# Patient Record
Sex: Female | Born: 1955 | Race: White | Hispanic: No | Smoking: Never smoker
Health system: Southern US, Community
[De-identification: ages and names within clinical notes are randomized; demographics above are authoritative.]

## PROBLEM LIST (undated history)

## (undated) DIAGNOSIS — I10 Essential (primary) hypertension: Secondary | ICD-10-CM

## (undated) DIAGNOSIS — Z973 Presence of spectacles and contact lenses: Secondary | ICD-10-CM

## (undated) DIAGNOSIS — E785 Hyperlipidemia, unspecified: Secondary | ICD-10-CM

## (undated) DIAGNOSIS — M1990 Unspecified osteoarthritis, unspecified site: Secondary | ICD-10-CM

## (undated) DIAGNOSIS — E669 Obesity, unspecified: Secondary | ICD-10-CM

## (undated) DIAGNOSIS — E039 Hypothyroidism, unspecified: Secondary | ICD-10-CM

## (undated) DIAGNOSIS — Z87440 Personal history of urinary (tract) infections: Secondary | ICD-10-CM

## (undated) HISTORY — PX: KNEE SURGERY: SHX244

## (undated) HISTORY — PX: DILATION AND CURETTAGE OF UTERUS: SHX78

## (undated) HISTORY — DX: Essential (primary) hypertension: I10

## (undated) HISTORY — DX: Hyperlipidemia, unspecified: E78.5

## (undated) HISTORY — PX: BREAST SURGERY: SHX581

---

## 2014-09-15 ENCOUNTER — Ambulatory Visit (INDEPENDENT_AMBULATORY_CARE_PROVIDER_SITE_OTHER): Payer: Managed Care, Other (non HMO) | Admitting: Podiatry

## 2014-09-15 ENCOUNTER — Encounter: Payer: Self-pay | Admitting: Podiatry

## 2014-09-15 VITALS — BP 145/71 | HR 68 | Ht 65.0 in | Wt 230.0 lb

## 2014-09-15 DIAGNOSIS — M779 Enthesopathy, unspecified: Secondary | ICD-10-CM

## 2014-09-15 DIAGNOSIS — L84 Corns and callosities: Secondary | ICD-10-CM | POA: Insufficient documentation

## 2014-09-15 DIAGNOSIS — M79606 Pain in leg, unspecified: Secondary | ICD-10-CM | POA: Insufficient documentation

## 2014-09-15 DIAGNOSIS — M7752 Other enthesopathy of left foot: Secondary | ICD-10-CM

## 2014-09-15 DIAGNOSIS — M79605 Pain in left leg: Secondary | ICD-10-CM

## 2014-09-15 NOTE — Progress Notes (Signed)
Subjective: 58 year old female presents complaining of pain in left foot with corns in between 4th and 5th digit. Been very painful for the past few days. She keeps corn pad and removes dead skin.  Objective: Neurovascular status are within normal. Positive of mild erythema at medial aspect of the PIPJ 4th digit left with corn, and painful corn on distal medial aspect of the 5th digit left foot. Mild fungal nail both great toes. Enlarged and palpable bone spur distal medial aspect of the 5th digit left with pain.  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 Painful corn 4th and 5th digit left.  Plan: Reviewed clinical findings and available treatment options.  May benefit from resection of spur 5th digit left foot.  Area debrided and padded.

## 2014-09-15 NOTE — Patient Instructions (Signed)
Seen for painful corn 4th and 5th digit left. Debrided and padded. May benefit from surgical removal of the bone spur on 5th digit left..  Return as needed.

## 2015-10-02 ENCOUNTER — Encounter: Payer: Self-pay | Admitting: Dietician

## 2015-10-02 ENCOUNTER — Encounter: Payer: BLUE CROSS/BLUE SHIELD | Attending: General Surgery | Admitting: Dietician

## 2015-10-02 DIAGNOSIS — Z713 Dietary counseling and surveillance: Secondary | ICD-10-CM | POA: Diagnosis not present

## 2015-10-02 DIAGNOSIS — Z6841 Body Mass Index (BMI) 40.0 and over, adult: Secondary | ICD-10-CM | POA: Insufficient documentation

## 2015-10-02 NOTE — Progress Notes (Signed)
  Pre-Op Assessment Visit:  Pre-Operative Sleeve Gastrectomy Surgery  Medical Nutrition Therapy:  Appt start time: 115   End time:  145  Patient was seen on 10/02/2015 for Pre-Operative Nutrition Assessment. Assessment [facility] on 10/02/2015.   Preferred Learning Style:   No preference indicated   Learning Readiness:   Ready  Handouts given during visit include:  Pre-Op Goals Bariatric Surgery Protein Shakes   During the appointment today the following Pre-Op Goals were reviewed with the patient: Maintain or lose weight as instructed by your surgeon Make healthy food choices Begin to limit portion sizes Limited concentrated sugars and fried foods Keep fat/sugar in the single digits per serving on   food labels Practice CHEWING your food  (aim for 30 chews per bite or until applesauce consistency) Practice not drinking 15 minutes before, during, and 30 minutes after each meal/snack Avoid all carbonated beverages  Avoid/limit caffeinated beverages  Avoid all sugar-sweetened beverages Consume 3 meals per day; eat every 3-5 hours Make a list of non-food related activities Aim for 64-100 ounces of FLUID daily  Aim for at least 60-80 grams of PROTEIN daily Look for a liquid protein source that contain ?15 g protein and ?5 g carbohydrate  (ex: shakes, drinks, shots)  Patient-Centered Goals: -Reduced knee pain (knee replacement) -Zipline -Amusement park rides -Horseback riding  -[place] trail hiking  Demonstrated degree of understanding via:  Teach Back  Teaching Method Utilized:  Visual Auditory Hands on  Barriers to learning/adherence to lifestyle change: none  [facility] to call the [facility] to enroll in Pre-Op and Post-Op Nutrition Education when surgery date is scheduled.

## 2015-11-03 ENCOUNTER — Encounter: Payer: BLUE CROSS/BLUE SHIELD | Attending: General Surgery | Admitting: Dietician

## 2015-11-03 ENCOUNTER — Encounter: Payer: Self-pay | Admitting: Dietician

## 2015-11-03 DIAGNOSIS — Z01818 Encounter for other preprocedural examination: Secondary | ICD-10-CM | POA: Insufficient documentation

## 2015-11-03 NOTE — Patient Instructions (Addendum)
Plan to do some more meal planning and preparation. Continue to work on chewing well.  Think about about trying protein shakes.  Talk to podiatrist about exercise ideas. Or go back to water aerobics or yoga.

## 2015-11-03 NOTE — Progress Notes (Signed)
  6 Months Supervised Weight Loss Visit:   Pre-Operative Sleeve Gastrectomy Surgery  Medical Nutrition Therapy:  Appt start time: 1640 end time:  1655.  Primary concerns today: Supervised Weight Loss Visit. Returns with no weight change. Has a bone spur on her heal and was on Prednisone for 6 days. Will be seeing a podiatrist to see next steps. Not able to walk like she would like because of the bone spur. Has been paying attention to portion sizes and working on chewing better.  Eating 3 meals per day. Drinks 1 coffee per day and drinks a lot of water or unsweet tea the rest of the day. Has cut back on fried foods though has some sweets some times. Does not snack much. Lives by herself and doesn't do a lot of cooking. Does not drink during meals.    Weight: 268.8 BMI: 44.7  Preferred Learning Style:   No preference indicated   Learning Readiness:   Ready   Recent physical activity:  none  Progress Towards Goal(s):  In progress.  Handouts given during visit include:  none    Nutritional Diagnosis:  [place]-3.3 Obesity related to past poor dietary habits and physical inactivity as evidenced by patient attending supervised weight loss for insurance approval of bariatric surgery.  Intervention:  Nutrition counseling provided. Plan: Plan to do some more meal planning and preparation. Continue to work on chewing well.  Think about about trying protein shakes.  Talk to podiatrist about exercise ideas. Or go back to water aerobics or yoga.   Teaching Method Utilized:  Visual Auditory Hands on  Barriers to learning/adherence to lifestyle change: none  Demonstrated degree of understanding via:  Teach Back   Monitoring/Evaluation:  Dietary intake, exercise, and body weight. Follow up in 1 months for 6 month supervised weight loss visit.

## 2015-12-02 ENCOUNTER — Encounter: Payer: Self-pay | Admitting: Dietician

## 2015-12-02 ENCOUNTER — Encounter: Payer: BLUE CROSS/BLUE SHIELD | Attending: General Surgery | Admitting: Dietician

## 2015-12-02 DIAGNOSIS — Z6841 Body Mass Index (BMI) 40.0 and over, adult: Secondary | ICD-10-CM | POA: Insufficient documentation

## 2015-12-02 DIAGNOSIS — Z713 Dietary counseling and surveillance: Secondary | ICD-10-CM | POA: Diagnosis not present

## 2015-12-02 NOTE — Progress Notes (Signed)
  6 Months Supervised Weight Loss Visit:   Pre-Operative Sleeve Gastrectomy Surgery  Medical Nutrition Therapy:  Appt start time: 500 end time:  515  Primary concerns today: Supervised Weight Loss Visit. Returns having gained 2 pounds. She reports she is still having trouble with bone spur. She is going to physical therapy and is off prednisone. Also plans [facility]. She tried the Premier protein shake and liked it okay; has it for breakfast and morning snack. Has been practicing chewing thoroughly and eating slowly. Finds this challenging. Working on meal planning and preparation.    Weight: 270.6 lbs BMI: 45.1  Preferred Learning Style:   No preference indicated   Learning Readiness:   Ready   Recent physical activity:  Physical activity 1x a week  Progress Towards Goal(s):  In progress.  Handouts given during visit include:  none    Nutritional Diagnosis:  [place]-3.3 Obesity related to past poor dietary habits and physical inactivity as evidenced by patient attending supervised weight loss for insurance approval of bariatric surgery.  Intervention:  Nutrition counseling provided. Plan: Plan to do some more meal planning and preparation. Continue to work on chewing well.  Keep using the buddy system for eating slowly when you're out to eat and exercise program.  Teaching Method Utilized:  Visual Auditory Hands on  Barriers to learning/adherence to lifestyle change: none  Demonstrated degree of understanding via:  Teach Back   Monitoring/Evaluation:  Dietary intake, exercise, and body weight. Follow up in 1 months for 6 month supervised weight loss visit.

## 2015-12-02 NOTE — Patient Instructions (Addendum)
Plan to do some more meal planning and preparation. Continue to work on chewing well.  Keep using the buddy system for eating slowly when you're out to eat and exercise program.

## 2016-01-05 ENCOUNTER — Ambulatory Visit: Payer: Self-pay | Admitting: Dietician

## 2016-01-17 ENCOUNTER — Encounter: Payer: BLUE CROSS/BLUE SHIELD | Attending: General Surgery | Admitting: Dietician

## 2016-01-17 ENCOUNTER — Encounter: Payer: Self-pay | Admitting: Dietician

## 2016-01-17 DIAGNOSIS — Z713 Dietary counseling and surveillance: Secondary | ICD-10-CM | POA: Diagnosis not present

## 2016-01-17 DIAGNOSIS — Z6841 Body Mass Index (BMI) 40.0 and over, adult: Secondary | ICD-10-CM | POA: Insufficient documentation

## 2016-01-17 NOTE — Progress Notes (Signed)
Supervised Weight Loss Class:  Appt start time: 1600   End time:  1630.  Patient was seen on 01/17/2016 for the "Fluid" Supervised Weight Loss Class at [facility].   Surgery type: Sleeve gastrectomy Start weight at [facility]: 269 lbs on 10/02/2015 Weight today: 277.6 lbs Weight change: 7 lbs gain  Objectives: -Discuss importance of fluid and hydration status after surgery -Identify appropriate fluids after surgery -Discuss importance of limiting caffeine in the perioperative period -Review importance of avoiding drinking while eating  Goals: -Begin to wean off of caffeine -Practice avoiding fluids 15 minutes before, during, and 30 minutes after meals -Begin to work your way up to 64 ounces of sugar free, caffeine free, non-carbonated fluids  Handouts given: "Fluid" handout

## 2016-02-15 ENCOUNTER — Ambulatory Visit: Payer: BLUE CROSS/BLUE SHIELD | Admitting: Dietician

## 2016-03-13 ENCOUNTER — Encounter: Payer: Self-pay | Admitting: Dietician

## 2016-03-13 ENCOUNTER — Encounter: Payer: BLUE CROSS/BLUE SHIELD | Attending: General Surgery | Admitting: Dietician

## 2016-03-13 DIAGNOSIS — Z6841 Body Mass Index (BMI) 40.0 and over, adult: Secondary | ICD-10-CM | POA: Insufficient documentation

## 2016-03-13 DIAGNOSIS — Z713 Dietary counseling and surveillance: Secondary | ICD-10-CM | POA: Diagnosis not present

## 2016-03-13 NOTE — Patient Instructions (Signed)
Continue do some more meal planning and preparation. Continue to work on chewing well.

## 2016-03-13 NOTE — Progress Notes (Signed)
  6 Months Supervised Weight Loss Visit:   Pre-Operative Sleeve Gastrectomy Surgery  Medical Nutrition Therapy:  Appt start time: 510 end time:  525  Primary concerns today: Supervised Weight Loss Visit. Returns having gained 4.7 lbs. Had the flu last month and having a D&C since she having some bleeding. [facility]. Joined a step counting group at work. Bone spur is getting better. Still having some Premier protein shakes. Always has been a fast eating and working on chewing slower. Drinks mostly water and has 1 cup of coffee per tea.  Still working on meal planning and preparation. Has some salads made up.  Surgery type: Sleeve gastrectomy [facility] lbs on 10/02/2015 Weight today: 272.9 lbs  Weight change: 4.7 lbs loss   Preferred Learning Style:   No preference indicated   Learning Readiness:   Ready   Recent physical activity:  Physical activity 1x a week  Progress Towards Goal(s):  In progress.  Handouts given during visit include:  none    Nutritional Diagnosis:  -3.3 Obesity related to past poor dietary habits and physical inactivity as evidenced by patient attending supervised weight loss for insurance approval of bariatric surgery.  Intervention:  Nutrition counseling provided. Plan: Continue do some more meal planning and preparation. Continue to work on chewing well.   Teaching Method Utilized:  Visual Auditory Hands on  Barriers to learning/adherence to lifestyle change: none  Demonstrated degree of understanding via:  Teach Back   Monitoring/Evaluation:  Dietary intake, exercise, and body weight. Follow up in 1 months for 6 month supervised weight loss visit.

## 2016-04-05 ENCOUNTER — Encounter: Payer: Self-pay | Admitting: Dietician

## 2016-04-05 ENCOUNTER — Encounter: Payer: BLUE CROSS/BLUE SHIELD | Attending: General Surgery | Admitting: Dietician

## 2016-04-05 DIAGNOSIS — Z6841 Body Mass Index (BMI) 40.0 and over, adult: Secondary | ICD-10-CM | POA: Diagnosis not present

## 2016-04-05 DIAGNOSIS — Z713 Dietary counseling and surveillance: Secondary | ICD-10-CM | POA: Diagnosis not present

## 2016-04-05 NOTE — Patient Instructions (Signed)
Continue do some more meal planning and preparation. Continue to work on chewing well.

## 2016-04-05 NOTE — Progress Notes (Signed)
  6 Months Supervised Weight Loss Visit:   Pre-Operative Sleeve Gastrectomy Surgery  Medical Nutrition Therapy:  Appt start time: 510 end time:  525  Primary concerns today: Supervised Weight Loss Visit. Returns having lost 4 lbs. Had a female cancer scare but she was found not to have cancer. Has UTI. Switched to decaf coffee but has not made many other changes this month.   [facility]. Joined a step counting group at work. Bone spur is getting better. Still having some Premier protein shakes. Always has been a fast eating and working on chewing slower.Still working on meal planning and preparation. Not having fried foods or sweets.   Surgery type: Sleeve gastrectomy [facility] lbs on 10[facility]02/2015  Weight today: 268.1 lbs  Weight change: 4 lbs loss   Preferred Learning Style:   No preference indicated   Learning Readiness:   Ready   Recent physical activity:  Physical activity 2x a week and walking   Progress Towards Goal(s):  In progress.  Handouts given during visit include:  none    Nutritional Diagnosis:  [place]-3.3 Obesity related to past poor dietary habits and physical inactivity as evidenced by patient attending supervised weight loss for insurance approval of bariatric surgery.  Intervention:  Nutrition counseling provided. Plan: Continue do some more meal planning and preparation. Continue to work on chewing well.   Teaching Method Utilized:  Visual Auditory Hands on  Barriers to learning/adherence to lifestyle change: none  Demonstrated degree of understanding via:  Teach Back   Monitoring/Evaluation:  Dietary intake, exercise, and body weight. Follow up to attend Pre Op Class.

## 2016-04-10 ENCOUNTER — Ambulatory Visit: Payer: BLUE CROSS/BLUE SHIELD | Admitting: Dietician

## 2016-06-08 ENCOUNTER — Other Ambulatory Visit (HOSPITAL_COMMUNITY): Payer: Self-pay | Admitting: General Surgery

## 2016-07-19 ENCOUNTER — Ambulatory Visit (HOSPITAL_COMMUNITY)
Admission: RE | Admit: 2016-07-19 | Discharge: 2016-07-19 | Disposition: A | Discharge status: Home / Self Care | Payer: BLUE CROSS/BLUE SHIELD | Source: Ambulatory Visit | Attending: General Surgery | Admitting: General Surgery

## 2016-07-19 ENCOUNTER — Other Ambulatory Visit (HOSPITAL_COMMUNITY): Payer: Self-pay | Admitting: General Surgery

## 2016-07-19 ENCOUNTER — Other Ambulatory Visit: Payer: Self-pay

## 2016-07-19 DIAGNOSIS — Z01818 Encounter for other preprocedural examination: Secondary | ICD-10-CM | POA: Diagnosis not present

## 2016-07-19 DIAGNOSIS — K228 Other specified diseases of esophagus: Secondary | ICD-10-CM | POA: Insufficient documentation

## 2016-08-28 ENCOUNTER — Encounter: Payer: BLUE CROSS/BLUE SHIELD | Attending: General Surgery | Admitting: Dietician

## 2016-08-28 ENCOUNTER — Encounter: Payer: Self-pay | Admitting: Dietician

## 2016-08-28 DIAGNOSIS — Z713 Dietary counseling and surveillance: Secondary | ICD-10-CM | POA: Diagnosis present

## 2016-08-28 NOTE — Progress Notes (Signed)
  Pre-Operative Nutrition Class:  Appt start time: 830   End time:  930.  Patient was seen on 08/28/2016 for Pre-Operative Bariatric Surgery Education at the [facility].   Surgery date: 09/11/2016 Surgery type: sleeve gastrectomy Start weight at [facility]: 269 lbs on 10/02/2015 Weight today: 275.2 lbs  TANITA  BODY COMP RESULTS  08/28/16   BMI (kg/m^2) 45.8   Fat Mass (lbs) 140   Fat Free Mass (lbs) 135.2   Total Body Water (lbs) 99.2   Samples given per MNT protocol. Patient educated on appropriate usage: Bariatric Advantage Multivitamin (mixed fruit - qty 1) Lot #: [id number] Exp: 05/2017  Bariatric Advantage Calcium Citrate chew (strawberry - qty 1) Lot #: [id number] Exp: 03/2017  Premier protein shake (strawberry - qty 1) Lot #: [id number] Exp: 07/2017  Renee Pain Protein Powder (chocolate splendor - qty 1) Lot #: [id number] Exp: 11/2017  The following the learning objectives were met by the patient during this course:  Identify Pre-Op Dietary Goals and will begin 2 weeks pre-operatively  Identify appropriate sources of fluids and proteins   State protein recommendations and appropriate sources pre and post-operatively  Identify Post-Operative Dietary Goals and will follow for 2 weeks post-operatively  Identify appropriate multivitamin and calcium sources  Describe the need for physical activity post-operatively and will follow MD recommendations  State when to call healthcare provider regarding medication questions or post-operative complications  Handouts given during class include:  Pre-Op Bariatric Surgery Diet Handout  Protein Shake Handout  Post-Op Bariatric Surgery Nutrition Handout  BELT Program Information Flyer  Support Group Information Flyer  [facility] Bariatric Supplements Price List  Follow-Up Plan: Patient will follow-up at [facility] 2 weeks post operatively for diet advancement per MD.

## 2016-08-30 ENCOUNTER — Ambulatory Visit: Payer: Self-pay | Admitting: General Surgery

## 2016-08-30 NOTE — H&P (Signed)
Margaret Obrien 08/30/2016 9:41 AM Location: [facility] Patient #: [id number] DOB: [birth year] Widowed / Language: Cleophus Molt / Race: White Female  History of Present Illness [name] M. Keelin Sheridan MD; 08/30/2016 10:26 AM) The patient is a 60 year old female who presents for a bariatric surgery evaluation. I initially met her 1 year ago for evaluation for weight loss surgery. She had undergo 6 month supervised weight loss. She has been approved for laparoscopic sleeve gastrectomy. She denies any major medical changes since she was last seen about a year ago. She did have to undergo a D&C for bleeding. Otherwise she denies any chest pain, chest pressure, source of breath, orthopnea, dyspnea on exertion, paroxysmal nocturnal dyspnea. She does take Lasix as needed for bilateral ankle edema. She denies any TIAs or amaurosis fugax. She denies any reflux. she last saw her cardiologist in 01/2015 Dr Gerarda Gunther and there were no concerns noted at that time  Her upper GI was within normal limits. Chest x-ray and EKG were also located. Labs done in September 2017 were within normal limits. She did test positive for H. pylori. She completed a Prevpac.  A comprehensive 12 point review of systems was performed and all systems are negative except for what is mentioned in the HPI  08/2015 She is referred by Dr Kandee Keen to discuss weight loss surgery. She is primarily interested in sleeve gastrectomy. It appeals to her because it is less invasive than a gastric bypass. It seems more effective than an adjustable gastric band. She participated in our online seminar. She states that she has struggled many years with her weight. She will lose the weight but ultimately regain it. She has tried Weight Watchers, phentermine, Nutrisystem, regular exercise at the [facility] without any long-term success. One of her goals is to be able to ride rides in an amusement park, hike the [employer].  Her  comorbidities include hypertension, dyslipidemia, left knee osteoarthritis  She denies any chest pain, chest pressure, source of breath, dyspnea on exertion, orthopnea, paroxysmal term dyspnea, prior blood clots, TIAs or amaurosis fugax. She denies any reflux. She denies any abdominal pain. She denies abdominal surgery. She denies any melena or hematochezia or constipation. She denies any dysuria. She does have left knee pain. She denies any lightheadedness or dizziness. She does take Synthroid for hypothyroidism. She denies any migraines. She denies any tobacco or drug use. She drinks alcohol on very rare occasion. She works at [employer] [place].  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 Colon Polyp Removal - Colonoscopy Breast Biopsy Left.  Diagnostic Studies History [name], MD; 08/30/2016 10:25 AM) Mammogram within last year Colonoscopy 5-10 years ago Pap Smear 1-5 years ago  Allergies Marjean Donna, [place]; 08/30/2016 9:41 AM) Lisinopril *ANTIHYPERTENSIVES*  Medication History [name], MD; 08/30/2016 10:25 AM) Synthroid (112MCG Tablet, Oral) Active. Cozaar (25MG Tablet, Oral) Active. Vitamin D (2000UNIT Tablet, Oral) Active. Fish Oil (1000MG Capsule DR, Oral) Active. Voltaren (50MG Tablet DR, Oral) Active. Furosemide (20MG Tablet, Oral) Active.  Medications Reconciled OxyCODONE HCl (5MG/5ML Solution, 5-10 Milliliter  Oral every four hours, as needed, Taken starting 08/30/2016) Active. Pantoprazole Sodium (40MG Tablet DR, 1 (one) Tablet Oral daily, Taken starting 08/30/2016) Active. Zofran ODT (4MG Tablet Disint, 1 (one) Tablet Disperse Oral every six hours, as needed, Taken starting 08/30/2016) Active.  Social History [name], MD; 08/30/2016 10:25 AM) Tobacco use Never smoker. No drug use No alcohol use Caffeine use Coffee, Tea.  Family History [name], MD; 08/30/2016 10:25 AM) Arthritis Brother, Mother. Alcohol Abuse Brother, Father. Respiratory Condition Father. Heart disease in female family member before age 94 Colon Cancer Sister. Cerebrovascular Accident Mother. Heart Disease Mother. Diabetes Mellitus Brother, Mother.  Pregnancy / Birth History [name], MD; 08/30/2016 10:25 AM) [name] 1 Contraceptive History Oral contraceptives. Maternal age 63-20 Irregular periods Age of menopause 13-55 Age at menarche 78 years. Para 1     Review of Systems [name] M. Akina Maish MD; 08/30/2016 10:21 AM) General Present- Weight Gain. Not Present- Appetite Loss, Chills, Fatigue, Fever, Night Sweats and Weight Loss. Skin Not Present- Change in Wart/Mole, Dryness, Hives, Jaundice, New Lesions, Non-Healing Wounds, Rash and Ulcer. HEENT Present- Wears glasses/contact lenses. Not Present- Earache, Hearing Loss, Hoarseness, Nose Bleed, Oral Ulcers, Ringing in the Ears, Seasonal Allergies, Sinus Pain, Sore Throat, Visual Disturbances and Yellow Eyes. Respiratory Not Present- Bloody sputum, Chronic Cough, Difficulty Breathing, Snoring and Wheezing. Cardiovascular Not Present- Chest Pain, Difficulty Breathing Lying Down, Leg Cramps, Palpitations, Rapid Heart Rate, Shortness of Breath and Swelling of Extremities. Gastrointestinal Not Present- Abdominal Pain, Bloating, Bloody Stool, Change in Bowel Habits, Chronic diarrhea, Constipation, Difficulty Swallowing, Excessive gas, Gets  full quickly at meals, Hemorrhoids, Indigestion, Nausea, Rectal Pain and Vomiting. Female Genitourinary Not Present- Frequency, Nocturia, Painful Urination, Pelvic Pain and Urgency. Musculoskeletal Present- Joint Pain. Not Present- Back Pain, Joint Stiffness, Muscle Pain, Muscle Weakness and Swelling of Extremities. Neurological Not Present- Decreased Memory, Fainting, Headaches, Numbness, Seizures, Tingling, Tremor, Trouble walking and Weakness. Psychiatric Not Present- Anxiety, Bipolar, Change in Sleep Pattern, Depression, Fearful and Frequent crying. Endocrine Not Present- Cold Intolerance, Excessive Hunger, Hair Changes, Heat Intolerance, Hot flashes and New Diabetes. Hematology Not Present- Easy Bruising, Excessive bleeding, Gland problems, HIV and Persistent Infections.  Vitals ([name] CMA; 08/30/2016 9:41 AM) 08/30/2016 9:41 AM Weight: 272.6 lb Height: 64.5in Body Surface Area: 2.24 m Body Mass Index: 46.07 kg/m  Temp.: 98.68F(Temporal)  Pulse: 76 (Regular)  BP: 128/76 (Sitting, Left Arm, Standard)      Physical Exam [name] M. Smokey Melott MD; 08/30/2016 10:21 AM)  General Mental Status-Alert. General Appearance-Consistent with stated age. Hydration-Well hydrated. Voice-Normal. Note: morbidly obese  Head and Neck Head-normocephalic, atraumatic with no lesions or palpable masses. Trachea-midline. Thyroid Gland Characteristics - normal size and consistency.  Eye Eyeball - Bilateral-Extraocular movements intact. Sclera/Conjunctiva - Bilateral-No scleral icterus.  Chest and Lung Exam Chest and lung exam reveals -quiet, even and easy respiratory effort with no use of accessory muscles and on auscultation, normal breath sounds, no adventitious sounds and normal vocal resonance. Inspection Chest Wall - Normal. Back - normal.  Breast - Did not examine.  Cardiovascular Cardiovascular examination reveals -normal heart sounds, regular rate and  rhythm with no murmurs and normal pedal pulses bilaterally.  Abdomen Inspection Inspection of the abdomen reveals - No Hernias. Skin - Scar - no surgical scars. Palpation/Percussion Palpation and Percussion of the abdomen reveal - Soft, Non Tender, No Rebound tenderness, No Rigidity (guarding) and No hepatosplenomegaly. Auscultation Auscultation of the abdomen reveals -  Bowel sounds normal.  Peripheral Vascular Upper Extremity Palpation - Pulses bilaterally normal.  Neurologic Neurologic evaluation reveals -alert and oriented x 3 with no impairment of recent or remote memory. Mental Status-Normal.  Neuropsychiatric The patient's mood and affect are described as -normal. Judgment and Insight-insight is appropriate concerning matters relevant to self.  Musculoskeletal Normal Exam - Left-Upper Extremity Strength Normal and Lower Extremity Strength Normal. Normal Exam - Right-Upper Extremity Strength Normal and Lower Extremity Strength Normal. Note: right knee crepitus  Lymphatic Head & Neck  General Head & Neck Lymphatics: Bilateral - Description - Normal. Axillary - Did not examine. Femoral & Inguinal - Did not examine.    Assessment & Plan [name] M. Hena Ewalt MD; 08/30/2016 10:25 AM)  OBESITY, MORBID, BMI 40.0-49.9 Impression: We reviewed her preoperative workup. We discussed results of her labs, upper GI. We discussed the importance of the preoperative diet plan. We rediscussed the typical hospitalization. We also rediscussed the potential risk for complications of laparoscopic sleeve gastrectomy such as bleeding, infection, injury to surrounding structures, reflux, leak, readmission, dehydration, death. We also discussed the typical postoperative course. She was given her postoperative pain, heartburn, and nausea prescriptions today. I encouraged her to contact the office should she have any additional questions between now and surgery.  Current Plans Pt Education -  EMW_preopbariatric Started Zofran ODT 4MG, 1 (one) Tablet Disperse every six hours, as needed, #20, 08/30/2016, No Refill. Started OxyCODONE HCl 5MG/5ML, 5-10 Milliliter every four hours, as needed, 200 Milliliter, 08/30/2016, No Refill. Started Pantoprazole Sodium 40MG, 1 (one) Tablet daily, #30, 30 days starting 08/30/2016, Ref. x3. POSITIVE H. PYLORI TEST (A04.8) Impression: We will asked pathology to test her extracted gastric remnant for residual H. 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 Redmond Pulling, MD, FACS General, Bariatric, & Minimally Invasive Surgery [facility], [place]

## 2016-09-07 NOTE — Patient Instructions (Addendum)
Margaret Obrien  09/08/2016   Your procedure is scheduled on: Monday September 11, 2016  [facility]  Entrance [place]  elevators to 3rd floor to  Short Stay Center at 9:15 AM.  Call this number if you have problems the morning of surgery [phone]   Remember: ONLY 1 PERSON MAY GO WITH YOU TO SHORT STAY TO GET  READY MORNING OF YOUR SURGERY.  Do not eat food or drink liquids :After Midnight.     Take these medicines the morning of surgery with A SIP OF WATER: Levothyroxine                                 You may not have any metal on your body including hair pins and              piercings  Do not wear jewelry, make-up, lotions, powders or perfumes, deodorant             Do not wear nail polish.  Do not shave  48 hours prior to surgery.                Do not bring valuables to the hospital. [facility] IS NOT             RESPONSIBLE   FOR VALUABLES.  Contacts, dentures or bridgework may not be worn into surgery.  Leave suitcase in the car. After surgery it may be brought to your room. _____________________________________________________________________             [facility] Pre[facility] Before surgery, you can play an important role.  Because skin is not sterile, your skin needs to be as free of germs as possible.  You can reduce the number of germs on your skin by washing with CHG (chlorahexidine gluconate) soap before surgery.  CHG is an antiseptic cleaner which kills germs and bonds with the skin to continue killing germs even after washing. Please DO NOT use if you have an allergy to CHG or antibacterial soaps.  If your skin becomes reddened/irritated stop using the CHG and inform your nurse when you arrive at Short Stay. Do not shave (including legs and underarms) for at least 48 hours prior to the first CHG shower.  You may shave your face/neck. Please follow these instructions carefully:  1.  Shower  with CHG Soap the night before surgery and the  morning of Surgery.  2.  If you choose to wash your hair, wash your hair first as usual with your  normal  shampoo.  3.  After you shampoo, rinse your hair and body thoroughly to remove the  shampoo.                           4.  Use CHG as you would any other liquid soap.  You can apply chg directly  to the skin and wash                       Gently with a scrungie or clean washcloth.  5.  Apply the CHG Soap to your body ONLY FROM THE NECK DOWN.  Do not use on face/ open                           Wound or open sores. Avoid contact with eyes, ears mouth and genitals (private parts).                       Wash face,  Genitals (private parts) with your normal soap.             6.  Wash thoroughly, paying special attention to the area where your surgery  will be performed.  7.  Thoroughly rinse your body with warm water from the neck down.  8.  DO NOT shower/wash with your normal soap after using and rinsing off  the CHG Soap.                9.  Pat yourself dry with a clean towel.            10.  Wear clean pajamas.            11.  Place clean sheets on your bed the night of your first shower and do not  sleep with pets. Day of Surgery : Do not apply any lotions/deodorants the morning of surgery.  Please wear clean clothes to the hospital/surgery center.  FAILURE TO FOLLOW THESE INSTRUCTIONS MAY RESULT IN THE CANCELLATION OF YOUR SURGERY PATIENT SIGNATURE_________________________________  NURSE SIGNATURE__________________________________  ________________________________________________________________________

## 2016-09-08 ENCOUNTER — Encounter (HOSPITAL_COMMUNITY)
Admission: RE | Admit: 2016-09-08 | Discharge: 2016-09-08 | Disposition: A | Discharge status: Home / Self Care | Payer: BLUE CROSS/BLUE SHIELD | Source: Ambulatory Visit | Attending: General Surgery | Admitting: General Surgery

## 2016-09-08 ENCOUNTER — Encounter (HOSPITAL_COMMUNITY): Payer: Self-pay

## 2016-09-08 HISTORY — DX: Hypothyroidism, unspecified: E03.9

## 2016-09-08 HISTORY — DX: Presence of spectacles and contact lenses: Z97.3

## 2016-09-08 HISTORY — DX: Personal history of urinary (tract) infections: Z87.440

## 2016-09-08 HISTORY — DX: Obesity, unspecified: E66.9

## 2016-09-08 HISTORY — DX: Unspecified osteoarthritis, unspecified site: M19.90

## 2016-09-08 LAB — COMPREHENSIVE METABOLIC PANEL
ALK PHOS: 62 U/L (ref 38–126)
ALT: 23 U/L (ref 14–54)
ANION GAP: 8 (ref 5–15)
AST: 24 U/L (ref 15–41)
Albumin: 4.4 g/dL (ref 3.5–5.0)
BILIRUBIN TOTAL: 0.8 mg/dL (ref 0.3–1.2)
BUN: 17 mg/dL (ref 6–20)
CALCIUM: 9.7 mg/dL (ref 8.9–10.3)
CO2: 26 mmol/L (ref 22–32)
Chloride: 103 mmol/L (ref 101–111)
Creatinine, Ser: 0.82 mg/dL (ref 0.44–1.00)
Glucose, Bld: 87 mg/dL (ref 65–99)
POTASSIUM: 4.2 mmol/L (ref 3.5–5.1)
Sodium: 137 mmol/L (ref 135–145)
TOTAL PROTEIN: 7.9 g/dL (ref 6.5–8.1)

## 2016-09-08 LAB — CBC WITH DIFFERENTIAL/PLATELET
BASOS PCT: 0 %
Basophils Absolute: 0 10*3/uL (ref 0.0–0.1)
Eosinophils Absolute: 0.2 10*3/uL (ref 0.0–0.7)
Eosinophils Relative: 3 %
HEMATOCRIT: 40.5 % (ref 36.0–46.0)
HEMOGLOBIN: 13 g/dL (ref 12.0–15.0)
LYMPHS ABS: 2.1 10*3/uL (ref 0.7–4.0)
LYMPHS PCT: 30 %
MCH: 27.7 pg (ref 26.0–34.0)
MCHC: 32.1 g/dL (ref 30.0–36.0)
MCV: 86.2 fL (ref 78.0–100.0)
MONO ABS: 0.5 10*3/uL (ref 0.1–1.0)
MONOS PCT: 7 %
NEUTROS ABS: 4.3 10*3/uL (ref 1.7–7.7)
NEUTROS PCT: 60 %
Platelets: 277 10*3/uL (ref 150–400)
RBC: 4.7 MIL/uL (ref 3.87–5.11)
RDW: 14.4 % (ref 11.5–15.5)
WBC: 7.1 10*3/uL (ref 4.0–10.5)

## 2016-09-11 ENCOUNTER — Inpatient Hospital Stay (HOSPITAL_COMMUNITY)
Admission: RE | Admit: 2016-09-11 | Discharge: 2016-09-13 | DRG: 621 | Disposition: A | Discharge status: Home / Self Care | Payer: BLUE CROSS/BLUE SHIELD | Source: Ambulatory Visit | Attending: General Surgery | Admitting: General Surgery

## 2016-09-11 ENCOUNTER — Encounter (HOSPITAL_COMMUNITY): Payer: Self-pay | Admitting: *Deleted

## 2016-09-11 ENCOUNTER — Inpatient Hospital Stay (HOSPITAL_COMMUNITY): Payer: BLUE CROSS/BLUE SHIELD | Admitting: Anesthesiology

## 2016-09-11 ENCOUNTER — Encounter (HOSPITAL_COMMUNITY)
Admission: RE | Disposition: A | Discharge status: Home / Self Care | Payer: Self-pay | Source: Ambulatory Visit | Attending: General Surgery

## 2016-09-11 DIAGNOSIS — M1712 Unilateral primary osteoarthritis, left knee: Secondary | ICD-10-CM | POA: Diagnosis present

## 2016-09-11 DIAGNOSIS — I10 Essential (primary) hypertension: Secondary | ICD-10-CM | POA: Diagnosis present

## 2016-09-11 DIAGNOSIS — E782 Mixed hyperlipidemia: Secondary | ICD-10-CM | POA: Diagnosis present

## 2016-09-11 DIAGNOSIS — Z9884 Bariatric surgery status: Secondary | ICD-10-CM

## 2016-09-11 DIAGNOSIS — E781 Pure hyperglyceridemia: Secondary | ICD-10-CM | POA: Diagnosis present

## 2016-09-11 DIAGNOSIS — E039 Hypothyroidism, unspecified: Secondary | ICD-10-CM | POA: Diagnosis present

## 2016-09-11 DIAGNOSIS — Z6841 Body Mass Index (BMI) 40.0 and over, adult: Secondary | ICD-10-CM

## 2016-09-11 DIAGNOSIS — E66813 Obesity, class 3: Secondary | ICD-10-CM | POA: Diagnosis present

## 2016-09-11 DIAGNOSIS — E7800 Pure hypercholesterolemia, unspecified: Secondary | ICD-10-CM | POA: Diagnosis present

## 2016-09-11 HISTORY — PX: LAPAROSCOPIC GASTRIC SLEEVE RESECTION: SHX5895

## 2016-09-11 LAB — HEMOGLOBIN AND HEMATOCRIT, BLOOD
HCT: 36.7 % (ref 36.0–46.0)
Hemoglobin: 12.1 g/dL (ref 12.0–15.0)

## 2016-09-11 SURGERY — GASTRECTOMY, SLEEVE, LAPAROSCOPIC
Anesthesia: General

## 2016-09-11 MED ORDER — MORPHINE SULFATE (PF) 2 MG/ML IV SOLN: 2.0000 mg | INTRAVENOUS | Status: DC | PRN

## 2016-09-11 MED ORDER — HYDROMORPHONE HCL 1 MG/ML IJ SOLN
0.2500 mg | INTRAMUSCULAR | Status: DC | PRN
  Administered 2016-09-11 (×4): 0.25 mg via INTRAVENOUS
  Administered 2016-09-11 (×2): 0.5 mg via INTRAVENOUS

## 2016-09-11 MED ORDER — PREMIER PROTEIN SHAKE
2.0000 [oz_av] | ORAL | Status: DC
  Administered 2016-09-13 (×2): 2 [oz_av] via ORAL

## 2016-09-11 MED ORDER — METOCLOPRAMIDE HCL 5 MG/ML IJ SOLN
INTRAMUSCULAR | Status: AC
  Filled 2016-09-11: qty 2

## 2016-09-11 MED ORDER — LIDOCAINE 2% (20 MG/ML) 5 ML SYRINGE
INTRAMUSCULAR | Status: DC | PRN
  Administered 2016-09-11: 40 mg via INTRAVENOUS

## 2016-09-11 MED ORDER — 0.9 % SODIUM CHLORIDE (POUR BTL) OPTIME
TOPICAL | Status: DC | PRN
  Administered 2016-09-11: 2000 mL
  Administered 2016-09-11: 1000 mL

## 2016-09-11 MED ORDER — BUPIVACAINE LIPOSOME 1.3 % IJ SUSP
20.0000 mL | Freq: Once | INTRAMUSCULAR | Status: AC
  Administered 2016-09-11: 20 mL
  Filled 2016-09-11: qty 20

## 2016-09-11 MED ORDER — FENTANYL CITRATE (PF) 100 MCG/2ML IJ SOLN
25.0000 ug | INTRAMUSCULAR | Status: DC | PRN
  Administered 2016-09-11 (×2): 50 ug via INTRAVENOUS

## 2016-09-11 MED ORDER — OXYCODONE HCL 5 MG/5ML PO SOLN
5.0000 mg | ORAL | Status: DC | PRN
  Administered 2016-09-12: 5 mg via ORAL
  Filled 2016-09-11: qty 5

## 2016-09-11 MED ORDER — DIPHENHYDRAMINE HCL 50 MG/ML IJ SOLN: 12.5000 mg | Freq: Three times a day (TID) | INTRAMUSCULAR | Status: DC | PRN

## 2016-09-11 MED ORDER — LACTATED RINGERS IV SOLN
INTRAVENOUS | Status: DC
  Administered 2016-09-11: 13:00:00 via INTRAVENOUS
  Administered 2016-09-11: 1000 mL via INTRAVENOUS

## 2016-09-11 MED ORDER — METOCLOPRAMIDE HCL 5 MG/ML IJ SOLN
INTRAMUSCULAR | Status: DC | PRN
  Administered 2016-09-11: 10 mg via INTRAVENOUS

## 2016-09-11 MED ORDER — LACTATED RINGERS IR SOLN
Status: DC | PRN
  Administered 2016-09-11: 1000 mL

## 2016-09-11 MED ORDER — SUGAMMADEX SODIUM 500 MG/5ML IV SOLN
INTRAVENOUS | Status: AC
  Filled 2016-09-11: qty 5

## 2016-09-11 MED ORDER — FENTANYL CITRATE (PF) 100 MCG/2ML IJ SOLN
INTRAMUSCULAR | Status: AC
  Filled 2016-09-11: qty 2

## 2016-09-11 MED ORDER — CHLORHEXIDINE GLUCONATE 4 % EX LIQD: 60.0000 mL | Freq: Once | CUTANEOUS | Status: DC

## 2016-09-11 MED ORDER — HYDROMORPHONE HCL 1 MG/ML IJ SOLN
INTRAMUSCULAR | Status: AC
  Filled 2016-09-11: qty 1

## 2016-09-11 MED ORDER — ONDANSETRON HCL 4 MG/2ML IJ SOLN
4.0000 mg | INTRAMUSCULAR | Status: DC | PRN
  Administered 2016-09-12: 4 mg via INTRAVENOUS
  Filled 2016-09-11: qty 2

## 2016-09-11 MED ORDER — ACETAMINOPHEN 10 MG/ML IV SOLN
1000.0000 mg | Freq: Four times a day (QID) | INTRAVENOUS | Status: AC
Start: 2016-09-11 — End: 2016-09-12
  Administered 2016-09-11 – 2016-09-12 (×4): 1000 mg via INTRAVENOUS
  Filled 2016-09-11 (×4): qty 100

## 2016-09-11 MED ORDER — SODIUM CHLORIDE 0.9 % IJ SOLN
INTRAMUSCULAR | Status: AC
  Filled 2016-09-11: qty 50

## 2016-09-11 MED ORDER — DIPHENHYDRAMINE HCL 50 MG/ML IJ SOLN
INTRAMUSCULAR | Status: DC | PRN
  Administered 2016-09-11: 12.5 mg via INTRAVENOUS

## 2016-09-11 MED ORDER — ROCURONIUM BROMIDE 10 MG/ML (PF) SYRINGE
PREFILLED_SYRINGE | INTRAVENOUS | Status: DC | PRN
  Administered 2016-09-11: 50 mg via INTRAVENOUS

## 2016-09-11 MED ORDER — APREPITANT 40 MG PO CAPS
40.0000 mg | ORAL_CAPSULE | ORAL | Status: AC
  Administered 2016-09-11: 40 mg via ORAL
  Filled 2016-09-11: qty 1

## 2016-09-11 MED ORDER — HEPARIN SODIUM (PORCINE) 5000 UNIT/ML IJ SOLN
5000.0000 [IU] | INTRAMUSCULAR | Status: AC
  Administered 2016-09-11: 5000 [IU] via SUBCUTANEOUS
  Filled 2016-09-11: qty 1

## 2016-09-11 MED ORDER — ACETAMINOPHEN 160 MG/5ML PO SOLN: 325.0000 mg | ORAL | Status: DC | PRN

## 2016-09-11 MED ORDER — DEXAMETHASONE SODIUM PHOSPHATE 10 MG/ML IJ SOLN
INTRAMUSCULAR | Status: AC
  Filled 2016-09-11: qty 1

## 2016-09-11 MED ORDER — DEXAMETHASONE SODIUM PHOSPHATE 4 MG/ML IJ SOLN
INTRAMUSCULAR | Status: DC | PRN
  Administered 2016-09-11: 10 mg via INTRAVENOUS

## 2016-09-11 MED ORDER — CEFOTETAN DISODIUM-DEXTROSE 2-2.08 GM-% IV SOLR
INTRAVENOUS | Status: AC
  Filled 2016-09-11: qty 50

## 2016-09-11 MED ORDER — EVICEL 5 ML EX KIT
PACK | Freq: Once | CUTANEOUS | Status: DC
  Filled 2016-09-11: qty 1

## 2016-09-11 MED ORDER — FAMOTIDINE IN NACL 20-0.9 MG/50ML-% IV SOLN
20.0000 mg | Freq: Two times a day (BID) | INTRAVENOUS | Status: DC
  Administered 2016-09-11 – 2016-09-12 (×3): 20 mg via INTRAVENOUS
  Filled 2016-09-11 (×4): qty 50

## 2016-09-11 MED ORDER — KCL IN DEXTROSE-NACL 20-5-0.45 MEQ/L-%-% IV SOLN
INTRAVENOUS | Status: DC
  Administered 2016-09-11: 17:00:00 via INTRAVENOUS
  Administered 2016-09-12: 125 mL/h via INTRAVENOUS
  Administered 2016-09-12: 10:00:00 via INTRAVENOUS
  Administered 2016-09-12: 125 mL/h via INTRAVENOUS
  Filled 2016-09-11 (×5): qty 1000

## 2016-09-11 MED ORDER — FENTANYL CITRATE (PF) 250 MCG/5ML IJ SOLN
INTRAMUSCULAR | Status: AC
  Filled 2016-09-11: qty 5

## 2016-09-11 MED ORDER — SUCCINYLCHOLINE CHLORIDE 200 MG/10ML IV SOSY
PREFILLED_SYRINGE | INTRAVENOUS | Status: DC | PRN
  Administered 2016-09-11: 150 mg via INTRAVENOUS

## 2016-09-11 MED ORDER — ONDANSETRON HCL 4 MG/2ML IJ SOLN
INTRAMUSCULAR | Status: DC | PRN
  Administered 2016-09-11: 4 mg via INTRAVENOUS

## 2016-09-11 MED ORDER — KETAMINE HCL 10 MG/ML IJ SOLN
INTRAMUSCULAR | Status: DC | PRN
  Administered 2016-09-11: 25 mg via INTRAVENOUS

## 2016-09-11 MED ORDER — CEFOTETAN DISODIUM-DEXTROSE 2-2.08 GM-% IV SOLR
2.0000 g | INTRAVENOUS | Status: AC
  Administered 2016-09-11: 2 g via INTRAVENOUS

## 2016-09-11 MED ORDER — PROMETHAZINE HCL 25 MG/ML IJ SOLN: 12.5000 mg | Freq: Four times a day (QID) | INTRAMUSCULAR | Status: DC | PRN

## 2016-09-11 MED ORDER — PROPOFOL 10 MG/ML IV BOLUS
INTRAVENOUS | Status: AC
  Filled 2016-09-11: qty 20

## 2016-09-11 MED ORDER — ONDANSETRON HCL 4 MG/2ML IJ SOLN
INTRAMUSCULAR | Status: AC
  Filled 2016-09-11: qty 2

## 2016-09-11 MED ORDER — LIDOCAINE 2% (20 MG/ML) 5 ML SYRINGE: INTRAMUSCULAR | Status: DC | PRN

## 2016-09-11 MED ORDER — SUGAMMADEX SODIUM 200 MG/2ML IV SOLN
INTRAVENOUS | Status: DC | PRN
  Administered 2016-09-11: 200 mg via INTRAVENOUS
  Administered 2016-09-11: 100 mg via INTRAVENOUS
  Administered 2016-09-11: 200 mg via INTRAVENOUS

## 2016-09-11 MED ORDER — PROPOFOL 10 MG/ML IV BOLUS
INTRAVENOUS | Status: DC | PRN
  Administered 2016-09-11: 200 mg via INTRAVENOUS

## 2016-09-11 MED ORDER — LIDOCAINE 2% (20 MG/ML) 5 ML SYRINGE
INTRAMUSCULAR | Status: AC
  Filled 2016-09-11: qty 5

## 2016-09-11 MED ORDER — ACETAMINOPHEN 160 MG/5ML PO SOLN
650.0000 mg | ORAL | Status: DC | PRN
Start: 2016-09-12 — End: 2016-09-13

## 2016-09-11 MED ORDER — MIDAZOLAM HCL 5 MG/5ML IJ SOLN
INTRAMUSCULAR | Status: DC | PRN
  Administered 2016-09-11: 2 mg via INTRAVENOUS

## 2016-09-11 MED ORDER — FENTANYL CITRATE (PF) 100 MCG/2ML IJ SOLN
INTRAMUSCULAR | Status: DC | PRN
  Administered 2016-09-11: 50 ug via INTRAVENOUS
  Administered 2016-09-11: 100 ug via INTRAVENOUS
  Administered 2016-09-11 (×2): 50 ug via INTRAVENOUS

## 2016-09-11 MED ORDER — ONDANSETRON HCL 4 MG/2ML IJ SOLN: 4.0000 mg | Freq: Once | INTRAMUSCULAR | Status: DC | PRN

## 2016-09-11 MED ORDER — ENOXAPARIN SODIUM 30 MG/0.3ML ~~LOC~~ SOLN
30.0000 mg | Freq: Two times a day (BID) | SUBCUTANEOUS | Status: DC
  Administered 2016-09-12 – 2016-09-13 (×3): 30 mg via SUBCUTANEOUS
  Filled 2016-09-11 (×3): qty 0.3

## 2016-09-11 MED ORDER — ORAL CARE MOUTH RINSE
15.0000 mL | Freq: Two times a day (BID) | OROMUCOSAL | Status: DC
  Administered 2016-09-12: 15 mL via OROMUCOSAL

## 2016-09-11 MED ORDER — HYDRALAZINE HCL 20 MG/ML IJ SOLN: 20.0000 mg | INTRAMUSCULAR | Status: DC | PRN

## 2016-09-11 MED ORDER — ROCURONIUM BROMIDE 10 MG/ML (PF) SYRINGE
PREFILLED_SYRINGE | INTRAVENOUS | Status: AC
  Filled 2016-09-11: qty 10

## 2016-09-11 MED ORDER — FENTANYL CITRATE (PF) 100 MCG/2ML IJ SOLN: 25.0000 ug | INTRAMUSCULAR | Status: DC | PRN

## 2016-09-11 MED ORDER — CHLORHEXIDINE GLUCONATE 0.12 % MT SOLN
15.0000 mL | Freq: Two times a day (BID) | OROMUCOSAL | Status: DC
  Administered 2016-09-11 – 2016-09-13 (×4): 15 mL via OROMUCOSAL
  Filled 2016-09-11 (×4): qty 15

## 2016-09-11 MED ORDER — BUPIVACAINE LIPOSOME 1.3 % IJ SUSP: INTRAMUSCULAR | Status: DC | PRN

## 2016-09-11 MED ORDER — SODIUM CHLORIDE 0.9 % IJ SOLN
INTRAMUSCULAR | Status: DC | PRN
  Administered 2016-09-11: 50 mL

## 2016-09-11 MED ORDER — MIDAZOLAM HCL 2 MG/2ML IJ SOLN
INTRAMUSCULAR | Status: AC
  Filled 2016-09-11: qty 2

## 2016-09-11 SURGICAL SUPPLY — 64 items
APPLICATOR COTTON TIP 6IN STRL (MISCELLANEOUS) IMPLANT
APPLIER CLIP ROT 10 11.4 M/L (STAPLE)
BLADE SURG SZ11 CARB STEEL (BLADE) ×3 IMPLANT
CABLE HIGH FREQUENCY MONO STRZ (ELECTRODE) ×3 IMPLANT
CHLORAPREP W/TINT 26ML (MISCELLANEOUS) ×6 IMPLANT
CLIP APPLIE ROT 10 11.4 M/L (STAPLE) IMPLANT
COVER SURGICAL LIGHT HANDLE (MISCELLANEOUS) ×3 IMPLANT
DECANTER SPIKE VIAL GLASS SM (MISCELLANEOUS) ×3 IMPLANT
DERMABOND ADVANCED (GAUZE/BANDAGES/DRESSINGS) ×4
DERMABOND ADVANCED .7 DNX12 (GAUZE/BANDAGES/DRESSINGS) ×2 IMPLANT
DEVICE SUT QUICK LOAD TK 5 (STAPLE) IMPLANT
DEVICE SUT TI-KNOT TK 5X26 (MISCELLANEOUS) IMPLANT
DEVICE SUTURE ENDOST 10MM (ENDOMECHANICALS) IMPLANT
DEVICE TI KNOT TK5 (MISCELLANEOUS)
DEVICE TROCAR PUNCTURE CLOSURE (ENDOMECHANICALS) ×3 IMPLANT
DRAPE UTILITY XL STRL (DRAPES) ×6 IMPLANT
ELECT L-HOOK LAP 45CM DISP (ELECTROSURGICAL)
ELECT PENCIL ROCKER SW 15FT (MISCELLANEOUS) IMPLANT
ELECT REM PT RETURN 9FT ADLT (ELECTROSURGICAL) ×3
ELECTRODE L-HOOK LAP 45CM DISP (ELECTROSURGICAL) IMPLANT
ELECTRODE REM PT RTRN 9FT ADLT (ELECTROSURGICAL) ×1 IMPLANT
GAUZE SPONGE 4X4 12PLY STRL (GAUZE/BANDAGES/DRESSINGS) IMPLANT
GLOVE BIO SURGEON STRL SZ7.5 (GLOVE) ×3 IMPLANT
GLOVE INDICATOR 8.0 STRL GRN (GLOVE) ×3 IMPLANT
GOWN STRL REUS W/TWL XL LVL3 (GOWN DISPOSABLE) ×9 IMPLANT
HOVERMATT SINGLE USE (MISCELLANEOUS) ×3 IMPLANT
IRRIG SUCT STRYKERFLOW 2 WTIP (MISCELLANEOUS) ×3
IRRIGATION SUCT STRKRFLW 2 WTP (MISCELLANEOUS) ×1 IMPLANT
KIT BASIN OR (CUSTOM PROCEDURE TRAY) ×3 IMPLANT
MARKER SKIN DUAL TIP RULER LAB (MISCELLANEOUS) ×3 IMPLANT
NEEDLE SPNL 22GX3.5 QUINCKE BK (NEEDLE) ×3 IMPLANT
PACK UNIVERSAL I (CUSTOM PROCEDURE TRAY) ×3 IMPLANT
QUICK LOAD TK 5 (STAPLE)
RELOAD STAPLER BLUE 60MM (STAPLE) ×2 IMPLANT
RELOAD STAPLER GOLD 60MM (STAPLE) ×1 IMPLANT
RELOAD STAPLER GREEN 60MM (STAPLE) ×2 IMPLANT
SCISSORS LAP 5X45 EPIX DISP (ENDOMECHANICALS) ×3 IMPLANT
SEALANT SURGICAL APPL DUAL CAN (MISCELLANEOUS) IMPLANT
SHEARS HARMONIC ACE PLUS 45CM (MISCELLANEOUS) ×3 IMPLANT
SLEEVE ADV FIXATION 5X100MM (TROCAR) ×6 IMPLANT
SLEEVE GASTRECTOMY 40FR VISIGI (MISCELLANEOUS) ×3 IMPLANT
SOLUTION ANTI FOG 6CC (MISCELLANEOUS) ×3 IMPLANT
SPONGE LAP 18X18 X RAY DECT (DISPOSABLE) ×3 IMPLANT
STAPLER ECHELON BIOABSB 60 FLE (MISCELLANEOUS) ×12 IMPLANT
STAPLER ECHELON LONG 60 440 (INSTRUMENTS) IMPLANT
STAPLER RELOAD BLUE 60MM (STAPLE) ×6
STAPLER RELOAD GOLD 60MM (STAPLE) ×3
STAPLER RELOAD GREEN 60MM (STAPLE) ×6
SUT MNCRL AB 4-0 PS2 18 (SUTURE) ×3 IMPLANT
SUT SURGIDAC NAB ES-9 0 48 120 (SUTURE) IMPLANT
SUT VICRYL 0 TIES 12 18 (SUTURE) ×3 IMPLANT
SYR 10ML ECCENTRIC (SYRINGE) ×3 IMPLANT
SYR 20CC LL (SYRINGE) ×3 IMPLANT
SYR 50ML LL SCALE MARK (SYRINGE) ×3 IMPLANT
TOWEL OR 17X26 10 PK STRL BLUE (TOWEL DISPOSABLE) ×3 IMPLANT
TOWEL OR NON WOVEN STRL DISP B (DISPOSABLE) ×3 IMPLANT
TRAY FOLEY W/METER SILVER 16FR (SET/KITS/TRAYS/PACK) IMPLANT
TROCAR ADV FIXATION 5X100MM (TROCAR) ×3 IMPLANT
TROCAR BLADELESS 15MM (ENDOMECHANICALS) ×3 IMPLANT
TROCAR BLADELESS OPT 5 100 (ENDOMECHANICALS) ×3 IMPLANT
TUBING CONNECTING 10 (TUBING) ×2 IMPLANT
TUBING CONNECTING 10' (TUBING) ×1
TUBING ENDO SMARTCAP (MISCELLANEOUS) ×3 IMPLANT
TUBING INSUF HEATED (TUBING) ×3 IMPLANT

## 2016-09-11 NOTE — Interval H&P Note (Signed)
History and Physical Interval Note:  09/11/2016 10:49 AM  [name]  has presented today for surgery, with the diagnosis of MORBID OBESITY  The various methods of treatment have been discussed with the patient and family. After consideration of risks, benefits and other options for treatment, the patient has consented to  Procedure(s): LAPAROSCOPIC GASTRIC SLEEVE RESECTION  WITH UPPER ENDO (N/A) as a surgical intervention .  The patient's history has been reviewed, patient examined, no change in status, stable for surgery.  I have reviewed the patient's chart and labs.  Questions were answered to the patient's satisfaction.    [name], MD, FACS General, Bariatric, & Minimally Invasive Surgery [facility], [place]  [facility]ILSON,[name]

## 2016-09-11 NOTE — Anesthesia Procedure Notes (Signed)
Procedure Name: Intubation Date/Time: 09/11/2016 11:31 AM Performed by: Ludwig LeanJONES, Shannelle Alguire C Pre-anesthesia Checklist: Patient identified, Emergency Drugs available, Suction available and Patient being monitored Patient Re-evaluated:Patient Re-evaluated prior to inductionOxygen Delivery Method: Circle system utilized Preoxygenation: Pre-oxygenation with 100% oxygen Intubation Type: IV induction Ventilation: Mask ventilation without difficulty Laryngoscope Size: Mac and 3 Grade View: Grade I Tube type: Oral Tube size: 7.0 mm Number of attempts: 1 Airway Equipment and Method: Oral airway Placement Confirmation: ETT inserted through vocal cords under direct vision,  positive ETCO2 and breath sounds checked- equal and bilateral Secured at: 18 (at the lip) cm Tube secured with: Tape Dental Injury: Teeth and Oropharynx as per pre-operative assessment

## 2016-09-11 NOTE — Anesthesia Preprocedure Evaluation (Signed)
Anesthesia Evaluation  Patient identified by MRN, date of birth, ID band Patient awake    Reviewed: Allergy & Precautions, NPO status , Patient's Chart, lab work & pertinent test results  History of Anesthesia Complications Negative for: history of anesthetic complications  Airway Mallampati: III  TM Distance: >3 FB Neck ROM: Full    Dental no notable dental hx. (+) Dental Advisory Given   Pulmonary neg pulmonary ROS,    Pulmonary exam normal breath sounds clear to auscultation       Cardiovascular hypertension, Pt. on medications Normal cardiovascular exam Rhythm:Regular Rate:Normal     Neuro/Psych negative neurological ROS  negative psych ROS   GI/Hepatic negative GI ROS, Neg liver ROS,   Endo/Other  Hypothyroidism Morbid obesity  Renal/GU negative Renal ROS  negative genitourinary   Musculoskeletal  (+) Arthritis ,   Abdominal   Peds negative pediatric ROS (+)  Hematology negative hematology ROS (+)   Anesthesia Other Findings   Reproductive/Obstetrics negative OB ROS                             Anesthesia Physical Anesthesia Plan  ASA: III  Anesthesia Plan: General   Post-op Pain Management:    Induction: Intravenous  Airway Management Planned: Oral ETT  Additional Equipment:   Intra-op Plan:   Post-operative Plan: Extubation in OR  Informed Consent: I have reviewed the patients History and Physical, chart, labs and discussed the procedure including the risks, benefits and alternatives for the proposed anesthesia with the patient or authorized representative who has indicated his/her understanding and acceptance.   Dental advisory given  Plan Discussed with: CRNA  Anesthesia Plan Comments:         Anesthesia Quick Evaluation

## 2016-09-11 NOTE — Transfer of Care (Signed)
Immediate Anesthesia Transfer of Care Note  Patient: Margaret Obrien  Procedure(s) Performed: Procedure(s): LAPAROSCOPIC GASTRIC SLEEVE RESECTION  WITH UPPER ENDO (N/A)  Patient Location: PACU  Anesthesia Type:General  Level of Consciousness: Patient easily awoken, sedated, comfortable, cooperative, following commands, responds to stimulation.   Airway & Oxygen Therapy: Patient spontaneously breathing, ventilating well, oxygen via simple oxygen mask.  Post-op Assessment: Report given to PACU RN, vital signs reviewed and stable, moving all extremities.   Post vital signs: Reviewed and stable.  Complications: No apparent anesthesia complications Last Vitals:  Vitals:   09/11/16 0910  BP: (!) 152/81  Pulse: 77  Resp: 16  Temp: 36.6 C    Last Pain:  Vitals:   09/11/16 0910  TempSrc: Oral      Patients Stated Pain Goal: 4 (09/11/16 0947)  Complications: No apparent anesthesia complications

## 2016-09-11 NOTE — Op Note (Signed)
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   Hypercholesterolemia with hypertriglyceridemia   Osteoarthritis of left knee   Essential hypertension   POST-OPERATIVE DIAGNOSIS:  same  PROCEDURE:  Procedure(s): LAPAROSCOPIC SLEEVE GASTRECTOMY  UPPER GI ENDOSCOPY  SURGEON:  Surgeon(s): Atilano InaEric M Leeann Bady, MD FACS FASMBS  ASSISTANTS: Glenna FellowsBenjamin Hoxworth MD FACS  ANESTHESIA:   general  DRAINS: none   BOUGIE: 40 fr ViSiGi  LOCAL MEDICATIONS USED:   Exparel  SPECIMEN:  Source of Specimen:  Greater curvature of stomach  DISPOSITION OF SPECIMEN:  PATHOLOGY  COUNTS:  YES  INDICATION FOR PROCEDURE: This is a very pleasant 60 y.o.-year-old morbidly obese female who has had unsuccessful attempts for sustained weight loss. The patient presents today for a planned laparoscopic sleeve gastrectomy with upper endoscopy. We have discussed the risk and benefits of the procedure extensively preoperatively. Please see my separate notes.  PROCEDURE: After obtaining informed consent and receiving 5000 units of subcutaneous heparin, the patient was [facility] and placed supine on the operating room table. General endotracheal anesthesia was established. Sequential compression devices were placed. A orogastric tube was placed. The patient's abdomen was prepped and draped in the usual standard surgical fashion. The patient received preoperative IV antibiotic. A surgical timeout was performed.  Access to the abdomen was achieved using a 5 mm 0 laparoscope thru a 5 mm trocar In the left upper Quadrant 2 fingerbreadths below the left subcostal margin using the Optiview technique. Pneumoperitoneum was smoothly established up to 15 mm of mercury. The laparoscope was advanced and the abdominal cavity was surveilled. The patient was then placed in reverse Trendelenburg. There was no evidence of a hiatal hernia on  laparoscopy - gap in the left and right crus anteriorly.  A 5 mm trocar was placed slightly above and to the left of the umbilicus under direct visualization.  The [facility] liver retractor was placed under the left lobe of the liver through a 5 mm trocar incision site in the subxiphoid position. A 5 mm trocar was placed in the lateral right upper quadrant along with a 15 mm trocar in the mid right abdomen. A final 5 mm trocar was placed in the lateral LUQ.  All under direct visualization after local had been infiltrated.  The stomach was inspected. It was completely decompressed and the orogastric tube was removed.  There was no anterior dimple that was obviously visible. Her preop UGI showed no hiatal hernia and she had no GERD.   We identified the pylorus and measured 6 cm proximal to the pylorus and identified an area of where we would start taking down the short gastric vessels. Harmonic scalpel was used to take down the short gastric vessels along the greater curvature of the stomach. We were able to enter the lesser sac. We continued to march along the greater curvature of the stomach taking down the short gastrics. As we approached the gastrosplenic ligament we took care in this area not to injure the spleen. We were able to take down the entire gastrosplenic ligament. We then mobilized the fundus away from the left crus of diaphragm. There were not any significant posterior gastric avascular attachments. This left the stomach completely mobilized. No vessels had been taken down along the lesser curvature of the stomach.  We then reidentified the pylorus. A 40Fr ViSiGi was then placed in the oropharynx and advanced down into the stomach and  placed in the distal antrum and positioned along the lesser curvature. It was placed under suction which secured the 40Fr ViSiGi in place along the lesser curve. Then using the Ethicon echelon 60 mm stapler with a green load with Seamguard, I placed a stapler  along the antrum approximately 5 cm from the pylorus. The stapler was angled so that there is ample room at the angularis incisura. I then fired the first staple load after inspecting it posteriorly to ensure adequate space both anteriorly and posteriorly. At this point I still was not completely past the angularis so with another green load with Seamguard, I placed the stapler in position just inside the prior stapleline. We then rotated the stomach to insure that there was adequate anteriorly as well as posteriorly. The stapler was then fired.. At this point I started using 60 mm gold load staple cartridge x1 with Seamguard. The echelon stapler was then repositioned with a 60 mm blue load with Seamguard and we continued to march up along the ViSiGi. My assistant was holding traction along the greater curvature stomach along the cauterized short gastric vessels ensuring that the stomach was symmetrically retracted. Prior to each firing of the staple, we rotated the stomach to ensure that there is adequate stomach left.  As we approached the fundus, I used 60 mm blue cartridge with Seamguard aiming slightly lateral to the esophageal fat pad. The sleeve was inspected. There is no evidence of cork screw. The staple line appeared hemostatic. The CRNA inflated the ViSiGi to the green zone and the upper abdomen was flooded with saline. There were no bubbles. The sleeve was decompressed and the ViSiGi removed. My assistant scrubbed out and performed an upper endoscopy. The sleeve easily distended with air and the scope was easily advanced to the pylorus. There is no evidence of internal bleeding or cork screwing. There was no significant narrowing at the angularis. There is no evidence of bubbles. Please see his operative note for further details. The gastric sleeve was decompressed and the endoscope was removed.  The greater curvature the stomach was grasped with a laparoscopic grasper and removed from the 15 mm trocar  site.  The liver retractor was removed. I then closed the 15 mm trocar site with 1 interrupted 0 Vicryl sutures through the fascia using the Endoflex ETS suture passer. The closure was viewed laparoscopically and it was airtight. 70 cc of Exparel was then infiltrated in the preperitoneal spaces around the trocar sites. Pneumoperitoneum was released. All trocar sites were closed with a 4-0 Monocryl in a subcuticular fashion followed by the application of Dermabond. The patient was extubated and taken to the recovery room in stable condition. All needle, instrument, and sponge counts were correct x2. There are no immediate complications  (2) 60 mm green with Seamguard (1) 60 mm gold with seamguard (2) 60 mm blue with seamguard  PLAN OF CARE: Admit to inpatient   PATIENT DISPOSITION:  PACU - hemodynamically stable.   Delay start of Pharmacological VTE agent (>24hrs) due to surgical blood loss or risk of bleeding:  no  Mary Sella. Andrey Campanile, MD, FACS FASMBS General, Bariatric, & Minimally Invasive Surgery [facility], [place]

## 2016-09-11 NOTE — H&P (View-Only) (Signed)
Margaret Obrien 08/30/2016 9:41 AM Location: [facility] Patient #: [id number] DOB: 12/20/1955 Widowed / Language: English / Race: White Female  History of Present Illness (Margaret Obrien; 08/30/2016 10:26 AM) The patient is a 60 year old female who presents for a bariatric Obrien evaluation. I initially met her 1 year ago for evaluation for weight loss Obrien. She had undergo 6 month supervised weight loss. She has been approved for laparoscopic sleeve gastrectomy. She denies any major medical changes since she was last seen about a year ago. She did have to undergo a D&C for bleeding. Otherwise she denies any chest pain, chest pressure, source of breath, orthopnea, dyspnea on exertion, paroxysmal nocturnal dyspnea. She does take Lasix as needed for bilateral ankle edema. She denies any TIAs or amaurosis fugax. She denies any reflux. she last saw her cardiologist in 01/2015 Margaret Obrien and there were no concerns noted at that time  Her upper GI was within normal limits. Chest x-ray and EKG were also located. Labs done in September 2017 were within normal limits. She did test positive for H. pylori. She completed a Prevpac.  A comprehensive 12 point review of systems was performed and all systems are negative except for what is mentioned in the HPI  08/2015 She is referred by Margaret Obrien. She is primarily interested in sleeve gastrectomy. It appeals to her because it is less invasive than a gastric bypass. It seems more effective than an adjustable gastric band. She participated in our online seminar. She states that she has struggled many years with her weight. She will lose the weight but ultimately regain it. She has tried Weight Watchers, phentermine, Nutrisystem, regular exercise at the [employer]-all without any long-term success. One of her goals is to be able to ride rides in an amusement park, hike the [place].  Her  comorbidities include hypertension, dyslipidemia, left knee osteoarthritis  She denies any chest pain, chest pressure, source of breath, dyspnea on exertion, orthopnea, paroxysmal term dyspnea, prior blood clots, TIAs or amaurosis fugax. She denies any reflux. She denies any abdominal pain. She denies abdominal Obrien. She denies any melena or hematochezia or constipation. She denies any dysuria. She does have left knee pain. She denies any lightheadedness or dizziness. She does take Synthroid for hypothyroidism. She denies any migraines. She denies any tobacco or drug use. She drinks alcohol on very rare occasion. She works at [employer].  She had a lipid panel drawn at her physician's office which showed a total cholesterol level of 215, triglyceride level of 224, HDL level 50, LDL level 120   Problem List/Past Medical (Margaret Obrien [name], Obrien; 08/30/2016 10:25 AM) OBESITY, MORBID, BMI 40.0-49.9 POSITIVE H. 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 Colon Polyp Removal - Colonoscopy Breast Biopsy Left.  Diagnostic Studies History (Margaret Obrien M Margaret Wadding, Obrien; 08/30/2016 10:25 AM) Mammogram within last year Colonoscopy 5-10 years ago Pap Smear 1-5 years ago  Allergies (Margaret Obrien, CMA; 08/30/2016 9:41 AM) Lisinopril *ANTIHYPERTENSIVES*  Medication History (Margaret Obrien M Margaret Gettis, Obrien; 08/30/2016 10:25 AM) Synthroid (112MCG Tablet, Oral) Active. Cozaar (25MG Tablet, Oral) Active. Vitamin D (2000UNIT Tablet, Oral) Active. Fish Oil (1000MG Capsule Margaret, Oral) Active. Voltaren (50MG Tablet Margaret, Oral) Active. Furosemide (20MG Tablet, Oral) Active.   Medications Reconciled OxyCODONE HCl (5MG/5ML Solution, 5-10 Milliliter  Oral every four hours, as needed, Taken starting 08/30/2016) Active. Pantoprazole Sodium (40MG Tablet Margaret, 1 (one) Tablet Oral daily, Taken starting 08/30/2016) Active. Zofran ODT (4MG Tablet Disint, 1 (one) Tablet Disperse Oral every six hours, as needed, Taken starting 08/30/2016) Active.  Social History Margaret Curry, Obrien; 08/30/2016 10:25 AM) Tobacco use Never smoker. No drug use No alcohol use Caffeine use Coffee, Tea.  Family History Margaret Curry, Obrien; 08/30/2016 10:25 AM) Arthritis Brother, Mother. Alcohol Abuse Brother, Father. Respiratory Condition Father. Heart disease in female family member before Obrien 94 Colon Cancer Sister. Cerebrovascular Accident Mother. Heart Disease Mother. Diabetes Mellitus Brother, Mother.  Pregnancy / Birth History Margaret Curry, Obrien; 08/30/2016 10:25 AM) Margaret Obrien 1 Contraceptive History Oral contraceptives. Maternal Obrien 63-20 Irregular periods Obrien of menopause 13-55 Obrien at menarche 78 years. Para 1     Review of Systems Margaret Obrien M. Margaret Bochicchio Obrien; 08/30/2016 10:21 AM) General Present- Weight Gain. Not Present- Appetite Loss, Chills, Fatigue, Fever, Night Sweats and Weight Loss. Skin Not Present- Change in Wart/Mole, Dryness, Hives, Jaundice, New Lesions, Non-Healing Wounds, Rash and Ulcer. HEENT Present- Wears glasses/contact lenses. Not Present- Earache, Hearing Loss, Hoarseness, Nose Bleed, Oral Ulcers, Ringing in the Ears, Seasonal Allergies, Sinus Pain, Sore Throat, Visual Disturbances and Yellow Eyes. Respiratory Not Present- Bloody sputum, Chronic Cough, Difficulty Breathing, Snoring and Wheezing. Cardiovascular Not Present- Chest Pain, Difficulty Breathing Lying Down, Leg Cramps, Palpitations, Rapid Heart Rate, Shortness of Breath and Swelling of Extremities. Gastrointestinal Not Present- Abdominal Pain, Bloating, Bloody Stool, Change in Bowel Habits, Chronic diarrhea, Constipation, Difficulty Swallowing, Excessive gas, Gets  full quickly at meals, Hemorrhoids, Indigestion, Nausea, Rectal Pain and Vomiting. Female Genitourinary Not Present- Frequency, Nocturia, Painful Urination, Pelvic Pain and Urgency. Musculoskeletal Present- Joint Pain. Not Present- Back Pain, Joint Stiffness, Muscle Pain, Muscle Weakness and Swelling of Extremities. Neurological Not Present- Decreased Memory, Fainting, Headaches, Numbness, Seizures, Tingling, Tremor, Trouble walking and Weakness. Psychiatric Not Present- Anxiety, Bipolar, Change in Sleep Pattern, Depression, Fearful and Frequent crying. Endocrine Not Present- Cold Intolerance, Excessive Hunger, Hair Changes, Heat Intolerance, Hot flashes and New Diabetes. Hematology Not Present- Easy Bruising, Excessive bleeding, Gland problems, HIV and Persistent Infections.  Vitals (Margaret Obrien CMA; 08/30/2016 9:41 AM) 08/30/2016 9:41 AM Weight: 272.6 lb Height: 64.5in Body Surface Area: 2.24 m Body Mass Index: 46.07 kg/m  Temp.: 98.68F(Temporal)  Pulse: 76 (Regular)  BP: 128/76 (Sitting, Left Arm, Standard)      Physical Exam Margaret Obrien M. Tamalyn Wadsworth Obrien; 08/30/2016 10:21 AM)  General Mental Status-Alert. General Appearance-Consistent with stated Obrien. Hydration-Well hydrated. Voice-Normal. Note: morbidly obese  Head and Neck Head-normocephalic, atraumatic with no lesions or palpable masses. Trachea-midline. Thyroid Gland Characteristics - normal size and consistency.  Eye Eyeball - Bilateral-Extraocular movements intact. Sclera/Conjunctiva - Bilateral-No scleral icterus.  Chest and Lung Exam Chest and lung exam reveals -quiet, even and easy respiratory effort with no use of accessory muscles and on auscultation, normal breath sounds, no adventitious sounds and normal vocal resonance. Inspection Chest Wall - Normal. Back - normal.  Breast - Did not examine.  Cardiovascular Cardiovascular examination reveals -normal heart sounds, regular rate and  rhythm with no murmurs and normal pedal pulses bilaterally.  Abdomen Inspection Inspection of the abdomen reveals - No Hernias. Skin - Scar - no surgical scars. Palpation/Percussion Palpation and Percussion of the abdomen reveal - Soft, Non Tender, No Rebound tenderness, No Rigidity (guarding) and No hepatosplenomegaly. Auscultation Auscultation of the abdomen reveals -  Bowel sounds normal.  Peripheral Vascular Upper Extremity Palpation - Pulses bilaterally normal.  Neurologic Neurologic evaluation reveals -alert and oriented x 3 with no impairment of recent or remote memory. Mental Status-Normal.  Neuropsychiatric The patient's mood and affect are described as -normal. Judgment and Insight-insight is appropriate concerning matters relevant to self.  Musculoskeletal Normal Exam - Left-Upper Extremity Strength Normal and Lower Extremity Strength Normal. Normal Exam - Right-Upper Extremity Strength Normal and Lower Extremity Strength Normal. Note: right knee crepitus  Lymphatic Head & Neck  General Head & Neck Lymphatics: Bilateral - Description - Normal. Axillary - Did not examine. Femoral & Inguinal - Did not examine.    Assessment & Plan Margaret Obrien M. Kaitlin Ardito Obrien; 08/30/2016 10:25 AM)  OBESITY, MORBID, BMI 40.0-49.9 Impression: We reviewed her preoperative workup. We discussed results of her labs, upper GI. We discussed the importance of the preoperative diet plan. We rediscussed the typical hospitalization. We also rediscussed the potential risk for complications of laparoscopic sleeve gastrectomy such as bleeding, infection, injury to surrounding structures, reflux, leak, readmission, dehydration, death. We also discussed the typical postoperative course. She was given her postoperative pain, heartburn, and nausea prescriptions today. I encouraged her to contact the office should she have any additional questions between now and Obrien.  Current Plans Pt Education -  EMW_preopbariatric Started Zofran ODT 4MG, 1 (one) Tablet Disperse every six hours, as needed, #20, 08/30/2016, No Refill. Started OxyCODONE HCl 5MG/5ML, 5-10 Milliliter every four hours, as needed, 200 Milliliter, 08/30/2016, No Refill. Started Pantoprazole Sodium 40MG, 1 (one) Tablet daily, #30, 30 days starting 08/30/2016, Ref. x3. POSITIVE H. PYLORI TEST (A04.8) Impression: We will asked pathology to test her extracted gastric remnant for residual H. 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 Redmond Pulling, Obrien, FACS General, Bariatric, & Minimally Invasive [facility] [place], [place]

## 2016-09-11 NOTE — Anesthesia Postprocedure Evaluation (Signed)
Anesthesia Post Note  Patient: Margaret MarionKathy Obrien  Procedure(s) Performed: Procedure(s) (LRB): LAPAROSCOPIC GASTRIC SLEEVE RESECTION  WITH UPPER ENDO (N/A)  Patient location during evaluation: PACU Anesthesia Type: General Level of consciousness: awake and alert Pain management: pain level controlled Vital Signs Assessment: post-procedure vital signs reviewed and stable Respiratory status: spontaneous breathing, nonlabored ventilation, respiratory function stable and patient connected to nasal cannula oxygen Cardiovascular status: blood pressure returned to baseline and stable Postop Assessment: no signs of nausea or vomiting Anesthetic complications: no    Last Vitals:  Vitals:   09/11/16 1500 09/11/16 1517  BP: (!) 159/75 (!) 152/73  Pulse: 70 72  Resp: 10 12  Temp: 36.4 C 36.6 C    Last Pain:  Vitals:   09/11/16 1500  TempSrc:   PainSc: 3                  Stephenia Vogan JENNETTE

## 2016-09-11 NOTE — Op Note (Signed)
Procedure: Upper GI endoscopy  Description of procedure: Upper GI endoscopy is performed at the completion of laparoscopic sleeve gastrectomy by Dr.  Andrey CampanileWilson  The video endoscope was introduced into the upper esophagus and then passed to the EG junction at about 35 cm. The esophagus appeared normal. The gastric sleeve was entered. The sleeve was tensely distended with air while the outlet was obstructed under saline irrigation by the operating surgeon. There was no evidence of leak. The staple line was intact and without bleeding. The scope was advanced to the antrum and pylorus visualized. There was no stricture or twisting or mucosal abnormality, and particularly no narrowing noted at the incisura.  The pouch was then desufflated and the scope withdrawn.  Mariella SaaBenjamin T Jalesa Thien MD, FACS  09/11/2016, 7:35 PM

## 2016-09-11 NOTE — Discharge Instructions (Addendum)
Aprepitant Discharge Instructions  °On the day of surgery you were given the medication aprepitant. This medication interacts with hormonal forms of birth control (oral contraceptives and injected or implanted birth control) and may make them ineffective. °IF YOU USE ANY HORMONAL FORM OF BIRTH CONTROL, YOU MUST USE AN ADDITIONAL BARRIER BIRTH CONTROL METHOD FOR ONE MONTH after receiving aprepitant or there is a chance you could become pregnant. ° ° ° ° °GASTRIC BYPASS/SLEEVE ° Home Care Instructions ° ° These instructions are to help you care for yourself when you go home. ° °Call: If you have any problems. °• Call [phone] and ask for the surgeon on call °• If you need immediate assistance come to the ER at [facility]. Tell the ER staff you are a new post-op gastric bypass or gastric sleeve patient  °Signs and symptoms to report: • Severe  vomiting or nausea °o If you cannot handle clear liquids for longer than 1 day, call your surgeon °• Abdominal pain which does not get better after taking your pain medication °• Fever greater than 100.4°  F and chills °• Heart rate over 100 beats a minute °• Trouble breathing °• Chest pain °• Redness,  swelling, drainage, or foul odor at incision (surgical) sites °• If your incisions open or pull apart °• Swelling or pain in calf (lower leg) °• Diarrhea (Loose bowel movements that happen often), frequent watery, uncontrolled bowel movements °• Constipation, (no bowel movements for 3 days) if this happens: °o Take Milk of Magnesia, 2 tablespoons by mouth, 3 times a day for 2 days if needed °o Stop taking Milk of Magnesia once you have had a bowel movement °o Call your doctor if constipation continues °Or °o Take Miralax  (instead of Milk of Magnesia) following the label instructions °o Stop taking Miralax once you have had a bowel movement °o Call your doctor if constipation continues °• Anything you think is “abnormal for you” °  °Normal side effects after surgery: • Unable  to sleep at night or unable to concentrate °• Irritability °• Being tearful (crying) or depressed ° °These are common complaints, possibly related to your anesthesia, stress of surgery, and change in lifestyle, that usually go away a few weeks after surgery. If these feelings continue, call your medical doctor.  °Wound Care: You may have surgical glue, steri-strips, or staples over your incisions after surgery °• Surgical glue: Looks like clear film over your incisions and will wear off a little at a time °• Steri-strips: Adhesive strips of tape over your incisions. You may notice a yellowish color on skin under the steri-strips. This is used to make the steri-strips stick better. Do not pull the steri-strips off - let them fall off °• Staples: Staples may be removed before you leave the hospital °o If you go home with staples, call [facility] for an appointment with your surgeon’s nurse to have staples removed 10 days after surgery, [phone] °• Showering: You may shower two (2) days after your surgery unless your surgeon tells you differently °o Wash gently around incisions with warm soapy water, rinse well, and gently pat dry °o If you have a drain (tube from your incision), you may need someone to hold this while you shower °o No tub baths until staples are removed and incisions are healed °  °Medications: • Medications should be liquid or crushed if larger than the size of a dime °• Extended release pills (medication that releases a little bit at   a time through the  day) should not be crushed °• Depending on the size and number of medications you take, you may need to space (take a few throughout the day)/change the time you take your medications so that you do not over-fill your pouch (smaller stomach) °• Make sure you follow-up with you primary care physician to make medication changes needed during rapid weight loss and life -style changes °• If you have diabetes, follow up with your  doctor that orders your diabetes medication(s) within one week after surgery and check your blood sugar regularly ° °• Do not drive while taking narcotics (pain medications) ° °• Do not take acetaminophen (Tylenol) and Roxicet or Lortab Elixir at the same time since these pain medications contain acetaminophen °  °Diet:  °First 2 Weeks You will see the nutritionist about two (2) weeks after your surgery. The nutritionist will increase the types of foods you can eat if you are handling liquids well: °• If you have severe vomiting or nausea and cannot handle clear liquids lasting longer than 1 day call your surgeon °Protein Shake °• Drink at least 2 ounces of shake 5-6 times per day °• Each serving of protein shakes (usually 8-12 ounces) should have a minimum of: °o 15 grams of protein °o And no more than 5 grams of carbohydrate °• Goal for protein each day: °o Men = 80 grams per day °o Women = 60 grams per day °  ° • Protein powder may be added to fluids such as non-fat milk or Lactaid milk or Soy milk (limit to 35 grams added protein powder per serving) ° °Hydration °• Slowly increase the amount of water and other clear liquids as tolerated (See Acceptable Fluids) °• Slowly increase the amount of protein shake as tolerated °• Sip fluids slowly and throughout the day °• May use sugar substitutes in small amounts (no more than 6-8 packets per day; i.e. Splenda) ° °Fluid Goal °• The first goal is to drink at least 8 ounces of protein shake/drink per day (or as directed by the nutritionist); some examples of protein shakes are Syntrax Nectar, Adkins Advantage, EAS Edge HP, and Unjury. - See handout from pre-op Bariatric Education Class: °o Slowly increase the amount of protein shake you drink as tolerated °o You may find it easier to slowly sip shakes throughout the day °o It is important to get your proteins in first °• Your fluid goal is to drink 64-100 ounces of fluid daily °o It may take a few weeks to build up to  this  °• 32 oz. (or more) should be clear liquids °And °• 32 oz. (or more) should be full liquids (see below for examples) °• Liquids should not contain sugar, caffeine, or carbonation ° °Clear Liquids: °• Water of Sugar-free flavored water (i.e. Fruit H²O, Propel) °• Decaffeinated coffee or tea (sugar-free) °• Crystal lite, Wyler’s Lite, Minute Maid Lite °• Sugar-free Jell-O °• Bouillon or broth °• Sugar-free Popsicle:    - Less than 20 calories each; Limit 1 per day ° °Full Liquids: °                  Protein Shakes/Drinks + 2 choices per day of other full liquids °• Full liquids must be: °o No More Than 12 grams of Carbs per serving °o No More Than 3 grams of Fat per serving °• Strained low-fat cream soup °• Non-Fat milk °• Fat-free Lactaid Milk °• Sugar-free yogurt (Dannon Lite & Fit, Greek   yogurt) ° °  °Vitamins and Minerals • Start 1 day after surgery unless otherwise directed by your surgeon °• 2 Chewable Multivitamin / Multimineral Supplement with iron (i.e. Centrum for Adults) °• Vitamin B-12, 350-500 micrograms sub-lingual (place tablet under the tongue) each day °• Chewable Calcium Citrate with Vitamin D-3 °(Example: 3 Chewable Calcium  Plus 600 with Vitamin D-3) °o Take 500 mg three (3) times a day for a total of 1500 mg each day °o Do not take all 3 doses of calcium at one time as it may cause constipation, and you can only absorb 500 mg at a time °o Do not mix multivitamins containing iron with calcium supplements;  take 2 hours apart °o Do not substitute Tums (calcium carbonate) for your calcium °• Menstruating women and those at risk for anemia ( a blood disease that causes weakness) may need extra iron °o Talk to your doctor to see if you need more iron °• If you need extra iron: Total daily Iron recommendation (including Vitamins) is 50 to 100 mg Iron/day °• Do not stop taking or change any vitamins or minerals until you talk to your nutritionist or surgeon °• Your nutritionist and/or surgeon must  approve all vitamin and mineral supplements °  °Activity and Exercise: It is important to continue walking at home. Limit your physical activity as instructed by your doctor. During this time, use these guidelines: °• Do not lift anything greater than ten  (10) pounds for at least two (2) weeks °• Do not go back to work or drive until your surgeon says you can °• You may have sex when you feel comfortable °o It is VERY important for female patients to use a reliable birth control method; fertility often increase after surgery °o Do not get pregnant for at least 18 months °• Start exercising as soon as your doctor tells you that you can °o Make sure your doctor approves any physical activity °• Start with a simple walking program °• Walk 5-15 minutes each day, 7 days per week °• Slowly increase until you are walking 30-45 minutes per day °• Consider joining our BELT program. [phone] or email [email or web address] °  °Special Instructions Things to remember: °• Free counseling is available for you and your family through collaboration between [facility] and [employer]. Please call [phone] and leave a message °• Use your CPAP when sleeping if this applies to you °• Consider buying a medical alert bracelet that says you had lap-band surgery °  °  You will likely have your first fill (fluid added to your band) 6 - 8 weeks after surgery °• [facility] has a free Bariatric Surgery Support Group that meets monthly, the 3rd Thursday, 6pm.  Education Center Classrooms. You can see classes online at [email or web address] °• It is very important to keep all follow up appointments with your surgeon, nutritionist, primary care physician, and behavioral health practitioner °o After the first year, please follow up with your bariatric surgeon and nutritionist at least once a year in order to maintain best weight loss results °      °             [facility]:  [phone] ° °             [facility]: [phone] ° °             Bariatric Nurse Coordinator: [phone]  °Gastric Bypass/Sleeve Home Care   Instructions  Rev. 12/2012    ° °                                                    Reviewed and Endorsed °                                                   by [place] Patient Education Committee, Jan, 2014 ° ° ° ° ° ° ° ° ° °

## 2016-09-12 LAB — COMPREHENSIVE METABOLIC PANEL
ALK PHOS: 50 U/L (ref 38–126)
ALT: 36 U/L (ref 14–54)
AST: 30 U/L (ref 15–41)
Albumin: 3.6 g/dL (ref 3.5–5.0)
Anion gap: 5 (ref 5–15)
BUN: 12 mg/dL (ref 6–20)
CALCIUM: 8.8 mg/dL — AB (ref 8.9–10.3)
CHLORIDE: 106 mmol/L (ref 101–111)
CO2: 27 mmol/L (ref 22–32)
CREATININE: 0.65 mg/dL (ref 0.44–1.00)
Glucose, Bld: 172 mg/dL — ABNORMAL HIGH (ref 65–99)
Potassium: 4.5 mmol/L (ref 3.5–5.1)
Sodium: 138 mmol/L (ref 135–145)
Total Bilirubin: 0.5 mg/dL (ref 0.3–1.2)
Total Protein: 6.9 g/dL (ref 6.5–8.1)

## 2016-09-12 LAB — CBC WITH DIFFERENTIAL/PLATELET
BASOS ABS: 0 10*3/uL (ref 0.0–0.1)
Basophils Relative: 0 %
Eosinophils Absolute: 0 10*3/uL (ref 0.0–0.7)
Eosinophils Relative: 0 %
HEMATOCRIT: 36.8 % (ref 36.0–46.0)
HEMOGLOBIN: 11.8 g/dL — AB (ref 12.0–15.0)
LYMPHS ABS: 0.8 10*3/uL (ref 0.7–4.0)
LYMPHS PCT: 7 %
MCH: 27.4 pg (ref 26.0–34.0)
MCHC: 32.1 g/dL (ref 30.0–36.0)
MCV: 85.4 fL (ref 78.0–100.0)
Monocytes Absolute: 0.5 10*3/uL (ref 0.1–1.0)
Monocytes Relative: 5 %
NEUTROS ABS: 9.6 10*3/uL — AB (ref 1.7–7.7)
NEUTROS PCT: 88 %
Platelets: 241 10*3/uL (ref 150–400)
RBC: 4.31 MIL/uL (ref 3.87–5.11)
RDW: 14.4 % (ref 11.5–15.5)
WBC: 10.9 10*3/uL — AB (ref 4.0–10.5)

## 2016-09-12 LAB — HEMOGLOBIN AND HEMATOCRIT, BLOOD
HEMATOCRIT: 37.5 % (ref 36.0–46.0)
HEMOGLOBIN: 11.9 g/dL — AB (ref 12.0–15.0)

## 2016-09-12 MED ORDER — OXYCODONE HCL 5 MG/5ML PO SOLN: 5.0000 mg | ORAL | 0 refills | Status: AC | PRN

## 2016-09-12 NOTE — Progress Notes (Signed)
The patient does not meet criteria for discharge because she did not meet oral intake goals and is at high risk of dehydration. Therefore discharge cancelled.   Margaret SellaEric M. Andrey CampanileWilson, MD, FACS General, Bariatric, & Minimally Invasive Surgery [facility], [place]

## 2016-09-12 NOTE — Plan of Care (Signed)
Problem: Food- and Nutrition-Related Knowledge Deficit (NB-1.1) Goal: Nutrition education Formal process to instruct or train a patient/client in a skill or to impart knowledge to help patients/clients voluntarily manage or modify food choices and eating behavior to maintain or improve health. Outcome: Completed/Met Date Met: 09/12/16 Nutrition Education Note  Received consult for diet education per DROP protocol.   Discussed 2 week post op diet with pt. Emphasized that liquids must be non carbonated, non caffeinated, and sugar free. Fluid goals discussed. Reviewed progression of diet to include soft proteins at 7-10 days post-op. Pt to follow up with outpatient bariatric RD for further diet progression after 2 weeks. Multivitamins and minerals also reviewed. Teach back method used, pt expressed understanding, expect good compliance.   Diet: First 2 Weeks  You will see the dietitian about two (2) weeks after your surgery. The dietitian will increase the types of foods you can eat if you are handling liquids well:  If you have severe vomiting or nausea and cannot handle clear liquids lasting longer than 1 day, call your surgeon  Protein Shake  Drink at least 2 ounces of shake 5-6 times per day  Each serving of protein shakes (usually 8 - 12 ounces) should have a minimum of:  15 grams of protein  And no more than 5 grams of carbohydrate  Goal for protein each day:  Men = 80 grams per day  Women = 60 grams per day  Protein powder may be added to fluids such as non-fat milk or Lactaid milk or Soy milk (limit to 35 grams added protein powder per serving)   Hydration  Slowly increase the amount of water and other clear liquids as tolerated (See Acceptable Fluids)  Slowly increase the amount of protein shake as tolerated  Sip fluids slowly and throughout the day  May use sugar substitutes in small amounts (no more than 6 - 8 packets per day; i.e. Splenda)   Fluid Goal  The first goal is to  drink at least 8 ounces of protein shake/drink per day (or as directed by the nutritionist); some examples of protein shakes are Syntrax Nectar, Adkins Advantage, EAS Edge HP, and Unjury. See handout from pre-op Bariatric Education Class:  Slowly increase the amount of protein shake you drink as tolerated  You may find it easier to slowly sip shakes throughout the day  It is important to get your proteins in first  Your fluid goal is to drink 64 - 100 ounces of fluid daily  It may take a few weeks to build up to this  32 oz (or more) should be clear liquids  And  32 oz (or more) should be full liquids (see below for examples)  Liquids should not contain sugar, caffeine, or carbonation   Clear Liquids:  Water or Sugar-free flavored water (i.e. Fruit H2O, Propel)  Decaffeinated coffee or tea (sugar-free)  Crystal Lite, Wyler's Lite, Minute Maid Lite  Sugar-free Jell-O  Bouillon or broth  Sugar-free Popsicle: *Less than 20 calories each; Limit 1 per day   Full Liquids:  Protein Shakes/Drinks + 2 choices per day of other full liquids  Full liquids must be:  No More Than 12 grams of Carbs per serving  No More Than 3 grams of Fat per serving  Strained low-fat cream soup  Non-Fat milk  Fat-free Lactaid Milk  Sugar-free yogurt (Dannon Lite & Fit, Greek yogurt)     Darbi Chandran, MS, RD, LDN Pager: [phone] After Hours Pager: [phone]    

## 2016-09-12 NOTE — Progress Notes (Signed)
Patient started on water yesterday evening tol with no problems.  Ambulating 4x each time.

## 2016-09-12 NOTE — Progress Notes (Signed)
Patient alert and oriented, Post op day 1.  Provided support and encouragement.  Encouraged pulmonary toilet, ambulation and small sips of liquids.  All questions answered.  Will continue to monitor. 

## 2016-09-12 NOTE — Discharge Summary (Signed)
Physician Discharge Summary  Margaret Obrien ZOX:[id number] DOB: [birth year] DOA: 09/11/2016  PCP: Cecile SheererMULLINS, JACKSON D, FNP  Admit date: 09/11/2016 Discharge date: 09/12/2016  Recommendations for Outpatient Follow-up:   Follow-up Information    Atilano InaWILSON,Giavana Rooke M, MD. Go on 09/29/2016.   Specialty:  General Surgery Why:  at 4:00 PM for post-op check Contact information: [address] [place] [address] [phone]        Atilano InaWILSON,Arnitra Sokoloski M, MD .   Specialty:  General Surgery Contact information: [address] [place] [address] [phone]          Discharge Diagnoses:  Principal Problem:   Obesity, Class III, BMI 40-49.9 (morbid obesity) (HCC) Active Problems:   Hypercholesterolemia with hypertriglyceridemia   Osteoarthritis of left knee   Essential hypertension   S/P laparoscopic sleeve gastrectomy   Surgical Procedure: Laparoscopic Sleeve Gastrectomy, upper endoscopy  Discharge Condition: Good Disposition: Home  Diet recommendation: Postoperative sleeve gastrectomy diet (liquids only)  Filed Weights   09/11/16 0910 09/12/16 0643  Weight: 120.7 kg (266 lb) 125.5 kg (276 lb 10.8 oz)     Hospital Course:  The patient was admitted for a planned laparoscopic sleeve gastrectomy. Please see operative note. Preoperatively the patient was given 5000 units of subcutaneous heparin for DVT prophylaxis. Postoperative prophylactic Lovenox dosing was started on the morning of postoperative day 1. On the evening of POD 0 she was doing well without fever or tachycardia and minimal nausea and patient was started on ice chips and water which they tolerated. On postoperative day 1The patient's diet was advanced to protein shakes which they also tolerated. The patient was ambulating without difficulty. Their vital signs are stable without fever or tachycardia. Their hemoglobin had remained stable.The patient had received discharge instructions and counseling. They  were deemed stable for discharge.  BP (!) 129/52 (BP Location: Right Arm)   Pulse 67   Temp 98.2 F (36.8 C) (Oral)   Resp 18   Ht 5' 4.5" (1.638 m)   Wt 125.5 kg (276 lb 10.8 oz)   LMP  (LMP Unknown)   SpO2 94%   BMI 46.76 kg/m   Gen: alert, NAD, non-toxic appearing Pupils: equal, no scleral icterus Pulm: Lungs clear to auscultation, symmetric chest rise CV: regular rate and rhythm Abd: soft, mild approp tender, nondistended.  No cellulitis. No incisional hernia Ext: no edema, no calf tenderness Skin: no rash, no jaundice   Discharge Instructions  Discharge Instructions    Ambulate hourly while awake    Complete by:  As directed    Call MD for:  difficulty breathing, headache or visual disturbances    Complete by:  As directed    Call MD for:  persistant dizziness or light-headedness    Complete by:  As directed    Call MD for:  persistant nausea and vomiting    Complete by:  As directed    Call MD for:  redness, tenderness, or signs of infection (pain, swelling, redness, odor or green/yellow discharge around incision site)    Complete by:  As directed    Call MD for:  severe uncontrolled pain    Complete by:  As directed    Call MD for:  temperature >101 F    Complete by:  As directed    Diet bariatric full liquid    Complete by:  As directed    Discharge instructions    Complete by:  As directed    See bariatric discharge instructions  Incentive spirometry    Complete by:  As directed    Perform hourly while awake       Medication List    TAKE these medications   cholecalciferol 1000 units tablet Commonly known as:  VITAMIN D Take 2,000 Units by mouth daily.   FISH OIL + D3 PO Take 1 capsule by mouth daily.   furosemide 20 MG tablet Commonly known as:  LASIX Take 20 mg by mouth daily as needed for fluid. Notes to patient:  Monitor Blood Pressure Daily and keep a log for primary care physician.  Monitor for symptoms of dehydration.  You may need to  make changes to your medications with rapid weight loss.     levothyroxine 125 MCG tablet Commonly known as:  SYNTHROID, LEVOTHROID Take 125 mcg by mouth daily before breakfast.   losartan 25 MG tablet Commonly known as:  COZAAR Take 25 mg by mouth 2 (two) times daily. Notes to patient:  Monitor Blood Pressure Daily and keep a log for primary care physician.  You may need to make changes to your medications with rapid weight loss.     oxyCODONE 5 MG/5ML solution Commonly known as:  ROXICODONE Take 5-10 mLs (5-10 mg total) by mouth every 4 (four) hours as needed for moderate pain or severe pain.      Follow-up Information    Atilano Ina, MD. Go on 09/29/2016.   Specialty:  General Surgery Why:  at 4:00 PM for post-op check Contact information: [address] [place] [address] [phone]        Atilano Ina, MD .   Specialty:  General Surgery Contact information: [address] [place] [address] [phone]            The results of significant diagnostics from this hospitalization (including imaging, microbiology, ancillary and laboratory) are listed below for reference.    Significant Diagnostic Studies: No results found.  Labs: Basic Metabolic Panel:  Recent Labs Lab 09/08/16 1435 09/12/16 0507  NA 137 138  K 4.2 4.5  CL 103 106  CO2 26 27  GLUCOSE 87 172*  BUN 17 12  CREATININE 0.82 0.65  CALCIUM 9.7 8.8*   Liver Function Tests:  Recent Labs Lab 09/08/16 1435 09/12/16 0507  AST 24 30  ALT 23 36  ALKPHOS 62 50  BILITOT 0.8 0.5  PROT 7.9 6.9  ALBUMIN 4.4 3.6    CBC:  Recent Labs Lab 09/08/16 1435 09/11/16 1323 09/12/16 0507  WBC 7.1  --  10.9*  NEUTROABS 4.3  --  9.6*  HGB 13.0 12.1 11.8*  HCT 40.5 36.7 36.8  MCV 86.2  --  85.4  PLT 277  --  241    CBG: No results for input(s): GLUCAP in the last 168 hours.  Principal Problem:   Obesity, Class III, BMI 40-49.9 (morbid obesity) (HCC) Active  Problems:   Hypercholesterolemia with hypertriglyceridemia   Osteoarthritis of left knee   Essential hypertension   S/P laparoscopic sleeve gastrectomy   Time coordinating discharge: 15 min  Signed:  Atilano Ina, MD [facility], [place] [phone] 09/12/2016, 10:38 AM

## 2016-09-13 LAB — CBC WITH DIFFERENTIAL/PLATELET
Basophils Absolute: 0 10*3/uL (ref 0.0–0.1)
Basophils Relative: 0 %
Eosinophils Absolute: 0 10*3/uL (ref 0.0–0.7)
Eosinophils Relative: 0 %
HEMATOCRIT: 35.5 % — AB (ref 36.0–46.0)
HEMOGLOBIN: 11.1 g/dL — AB (ref 12.0–15.0)
LYMPHS ABS: 2.4 10*3/uL (ref 0.7–4.0)
LYMPHS PCT: 26 %
MCH: 27.3 pg (ref 26.0–34.0)
MCHC: 31.3 g/dL (ref 30.0–36.0)
MCV: 87.4 fL (ref 78.0–100.0)
MONOS PCT: 8 %
Monocytes Absolute: 0.7 10*3/uL (ref 0.1–1.0)
NEUTROS ABS: 6.1 10*3/uL (ref 1.7–7.7)
NEUTROS PCT: 66 %
Platelets: 217 10*3/uL (ref 150–400)
RBC: 4.06 MIL/uL (ref 3.87–5.11)
RDW: 14.9 % (ref 11.5–15.5)
WBC: 9.2 10*3/uL (ref 4.0–10.5)

## 2016-09-13 NOTE — Progress Notes (Signed)
Patient alert and oriented, pain is controlled. Patient is tolerating fluids, advanced to protein shake yesterday, patient is tolerating well.  Reviewed Gastric sleeve discharge instructions with patient and patient is able to articulate understanding.  Provided information on BELT program, Support Group and [facility] outpatient pharmacy. All questions answered, will continue to monitor.  

## 2016-09-13 NOTE — Progress Notes (Signed)
Patient ID: Margaret Obrien, female   DOB: Oct 10, 1956, 60 y.o.   MRN: [id number]    Progress Note: Metabolic and Bariatric Surgery Service   Subjective: Feels a lot better. Liquids going down much smoother. Did about 4oz last night of shake and water. Already did 2oz shake this am and starting 2nd 2 oz. No epigastric pain  Objective: Vital signs in last 24 hours: Temp:  [97.8 F (36.6 C)-98.6 F (37 C)] 97.8 F (36.6 C) (10/18 0620) Pulse Rate:  [58-69] 69 (10/18 0620) Resp:  [16-18] 16 (10/18 0620) BP: (118-134)/(52-62) 134/62 (10/18 0620) SpO2:  [93 %-100 %] 97 % (10/18 0620) Weight:  [125.4 kg (276 lb 7.3 oz)] 125.4 kg (276 lb 7.3 oz) (10/18 0620) Last BM Date: 09/12/16  Intake/Output from previous day: 10/17 0701 - 10/18 0700 In: 3871.3 [P.O.:390; I.V.:3431.3; IV Piggyback:50] Out: 2000 [Urine:2000] Intake/Output this shift: Total I/O In: 60 [P.O.:60] Out: -   Lungs: cta  Cardiovascular: reg  Abd: soft, min to nt, incisions c/d/i  Extremities: no edema  Neuro: alert  Lab Results: CBC   Recent Labs  09/12/16 0507 09/12/16 1553 09/13/16 0431  WBC 10.9*  --  9.2  HGB 11.8* 11.9* 11.1*  HCT 36.8 37.5 35.5*  PLT 241  --  217   BMET  Recent Labs  09/12/16 0507  NA 138  K 4.5  CL 106  CO2 27  GLUCOSE 172*  BUN 12  CREATININE 0.65  CALCIUM 8.8*   PT/INR No results for input(s): LABPROT, INR in the last 72 hours. ABG No results for input(s): PHART, HCO3 in the last 72 hours.  Invalid input(s): PCO2, PO2  Studies/Results:  Anti-infectives: Anti-infectives    Start     Dose/Rate Route Frequency Ordered Stop   09/11/16 0915  cefoTEtan in Dextrose 5% (CEFOTAN) IVPB 2 g     2 g Intravenous On call to O.R. 09/11/16 0915 09/11/16 1132      Medications: Scheduled Meds: . chlorhexidine  15 mL Mouth Rinse BID  . enoxaparin (LOVENOX) injection  30 mg Subcutaneous Q12H  . famotidine (PEPCID) IV  20 mg Intravenous Q12H  . mouth rinse  15 mL Mouth Rinse  [id number]  . protein supplement shake  2 oz Oral Q2H   Continuous Infusions: . dextrose 5 % and 0.45 % NaCl with KCl 20 mEq/L Stopped (09/13/16 0339)   PRN Meds:.oxyCODONE **AND** acetaminophen, acetaminophen (TYLENOL) oral liquid 160 mg/5 mL, diphenhydrAMINE, hydrALAZINE, morphine injection, ondansetron (ZOFRAN) IV, promethazine  Assessment/Plan: Patient Active Problem List   Diagnosis Date Noted  . Obesity, Class III, BMI 40-49.9 (morbid obesity) (HCC) 09/11/2016  . Hypercholesterolemia with hypertriglyceridemia 09/11/2016  . Osteoarthritis of left knee 09/11/2016  . Essential hypertension 09/11/2016  . S/P laparoscopic sleeve gastrectomy 09/11/2016  . Corns and callosities 09/15/2014  . Bone spur of left foot 09/15/2014  . Pain in lower limb 09/15/2014   s/p Procedure(s): LAPAROSCOPIC GASTRIC SLEEVE RESECTION  WITH UPPER ENDO 09/11/2016  Looks good. Tolerating liquids better Safe for dc Education Discussed dc instructions  Disposition:  LOS: 2 days  The patient should be discharged from the hospital today  Atilano InaWILSON,Kaileia Flow M, MD [phone] [facility]

## 2016-09-26 ENCOUNTER — Encounter: Payer: BLUE CROSS/BLUE SHIELD | Admitting: Dietician

## 2016-09-26 DIAGNOSIS — Z713 Dietary counseling and surveillance: Secondary | ICD-10-CM | POA: Diagnosis not present

## 2016-09-26 NOTE — Progress Notes (Signed)
Bariatric Class:  Appt start time: 1530 end time:  1630.  2 Week Post-Operative Nutrition Class  Patient was seen on 09/26/2016 for Post-Operative Nutrition education [facility].   Surgery date: 09/11/2016 Surgery type: sleeve gastrectomy Start weight at [facility]: 269 lbs on 10/02/2015, 275.2 08/28/2016 Weight today: 256 lbs  Weight change: 19.2 lbs  TANITA  BODY COMP RESULTS  08/28/16 09/26/16   BMI (kg/m^2) 45.8 42.6   Fat Mass (lbs) 140 123.4   Fat Free Mass (lbs) 135.2 132.6   Total Body Water (lbs) 99.2 96.6   The following the learning objectives were met by the patient during this course:  Identifies Phase 3A (Soft, High Proteins) Dietary Goals and will begin from 2 weeks post-operatively to 2 months post-operatively  Identifies appropriate sources of fluids and proteins   States protein recommendations and appropriate sources post-operatively  Identifies the need for appropriate texture modifications, mastication, and bite sizes when consuming solids  Identifies appropriate multivitamin and calcium sources post-operatively  Describes the need for physical activity post-operatively and will follow MD recommendations  States when to call healthcare provider regarding medication questions or post-operative complications  Handouts given during class include:  Phase 3A: Soft, High Protein Diet Handout  Follow-Up Plan: Patient will follow-up at [facility] in 6 weeks for 2 month post-op nutrition visit for diet advancement per MD.    

## 2016-11-07 ENCOUNTER — Ambulatory Visit: Payer: BLUE CROSS/BLUE SHIELD | Admitting: Dietician

## 2016-11-08 ENCOUNTER — Encounter: Payer: BLUE CROSS/BLUE SHIELD | Attending: General Surgery | Admitting: Dietician

## 2016-11-08 ENCOUNTER — Encounter: Payer: Self-pay | Admitting: Dietician

## 2016-11-08 DIAGNOSIS — Z713 Dietary counseling and surveillance: Secondary | ICD-10-CM | POA: Diagnosis not present

## 2016-11-08 NOTE — Progress Notes (Signed)
  Follow-up visit:  8 Weeks Post-Operative Sleeve gastrectomy Surgery  Medical Nutrition Therapy:  Appt start time: 0805 end time:  0830.  Primary concerns today: Post-operative Bariatric Surgery Nutrition Management.  Margaret MessierKathy returns having lost a total of 37 lbs. She states she has been following the dietary recommendations very strictly but feels ready for the next diet phase. Tolerating all recommended foods and can eat up to 3 oz of meat at a time. Having some taste changes and noticed this in June when she was on Levaquin for bronchitis. She states everything still smells the same; plans to see an ENT specialist tomorrow.  Surgery date: 09/11/2016 Surgery type: sleeve gastrectomy [facility] lbs on 10/02/2015, 275.2 08/28/2016 Weight today: 237.6 lbs Weight change: 19 lbs Total weight lost: 37.4 lbs  TANITA  BODY COMP RESULTS  08/28/16 09/26/16 11/08/16   BMI (kg/m^2) 45.8 42.6 39.5   Fat Mass (lbs) 140 123.4 113.6   Fat Free Mass (lbs) 135.2 132.6 124   Total Body Water (lbs) 99.2 96.6 90    Preferred Learning Style:   No preference indicated   Learning Readiness:   Ready  24-hr recall: B (AM): scrambled egg, Premier shake (36g) Snk (AM):   L (PM): Wendy's chili (10g) Snk (PM): string cheese (7g)  D (PM): 3 oz chicken with green beans (21g)  Snk (PM):   Fluid intake: water, decaf coffee, 11 oz protein shake (50-60 oz total per patient) Estimated total protein intake: 70-80g  Medications: see list Supplementation: taking  Drinking while eating: no Hair loss: no Carbonated beverages: none N/V/D/C: constipation Dumping syndrome: none  Recent physical activity:  [employer] 3x a week (treadmill and weight machines)  Progress Towards Goal(s):  In progress.  Handouts given during visit include:  Phase 3B lean protein and non starchy vegetables   Nutritional Diagnosis:  [place]-3.3 Overweight/obesity related to past poor dietary habits and physical inactivity  as evidenced by patient w/ recent sleeve gastrectomy surgery following dietary guidelines for continued weight loss.     Intervention:  Nutrition counseling provided.  Teaching Method Utilized:  Visual Auditory Hands on  Barriers to learning/adherence to lifestyle change: none  Demonstrated degree of understanding via:  Teach Back   Monitoring/Evaluation:  Dietary intake, exercise, and body weight. Follow up prn.

## 2016-11-08 NOTE — Patient Instructions (Signed)
Goals:  Follow Phase 3B: High Protein + Non-Starchy Vegetables  Eat 3-6 small meals/snacks, every 3-5 hrs  Increase lean protein foods to meet 60g goal  Increase fluid intake to 64oz +  Avoid drinking 15 minutes before, during and 30 minutes after eating  Aim for >30 min of physical activity daily  Surgery date: 09/11/2016 Surgery type: sleeve gastrectomy [facility] 269 lbs on 10/02/2015, 275.2 08/28/2016 Weight today: 237.6 lbs Weight change: 19 lbs Total weight lost: 37.4 lbs  TANITA  BODY COMP RESULTS  08/28/16 09/26/16 11/08/16   BMI (kg/m^2) 45.8 42.6 39.5   Fat Mass (lbs) 140 123.4 113.6   Fat Free Mass (lbs) 135.2 132.6 124   Total Body Water (lbs) 99.2 96.6 90

## 2016-12-25 IMAGING — RF DG UGI W/ KUB
11 of 17 series · 13 of 24 positions shown · non-contrast
Comparison: Chest radiographs from today reported separately.

CLINICAL DATA: 60-year-old female preoperative study for sleeve
gastrectomy. Initial encounter.

EXAM:
UPPER GI SERIES WITH KUB
TECHNIQUE: After obtaining a scout radiograph a routine upper GI series was
performed using barium
FLUOROSCOPY TIME:  Radiation Exposure Index (as provided by the
fluoroscopic device): 30.8 mGy
If the device does not provide the exposure index:
Fluoroscopy Time (in minutes and seconds):  2 minutes 0 seconds

[Series 1: t abdomen supine · 0.15mm/px · 1 of 1 slices shown]
[im 1/1]
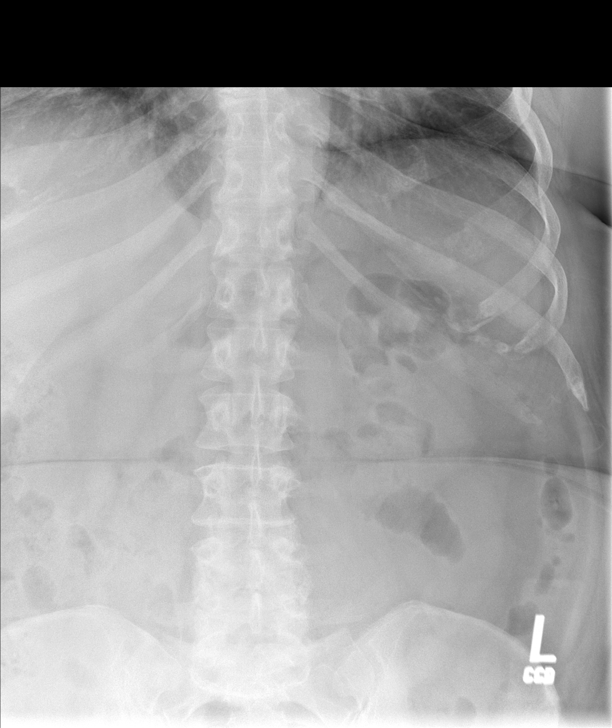

[Series 2: cp_standard · 0.36mm/px · 1 of 19 frames shown (1 of 9)]
[frame 10/19]
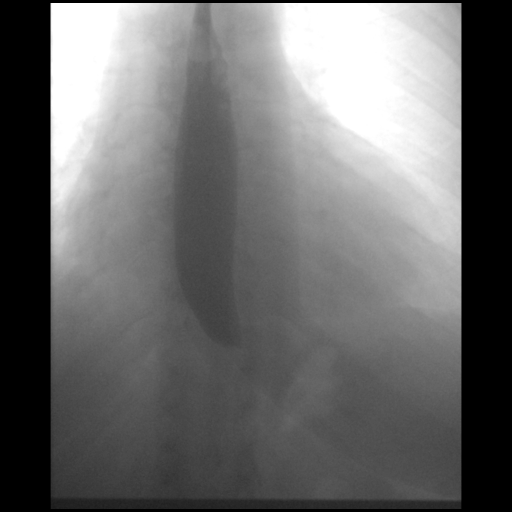

[Series 3: cp_standard · 0.18mm/px · 1 of 1 slices shown (2 of 9)]
[im 1/1]
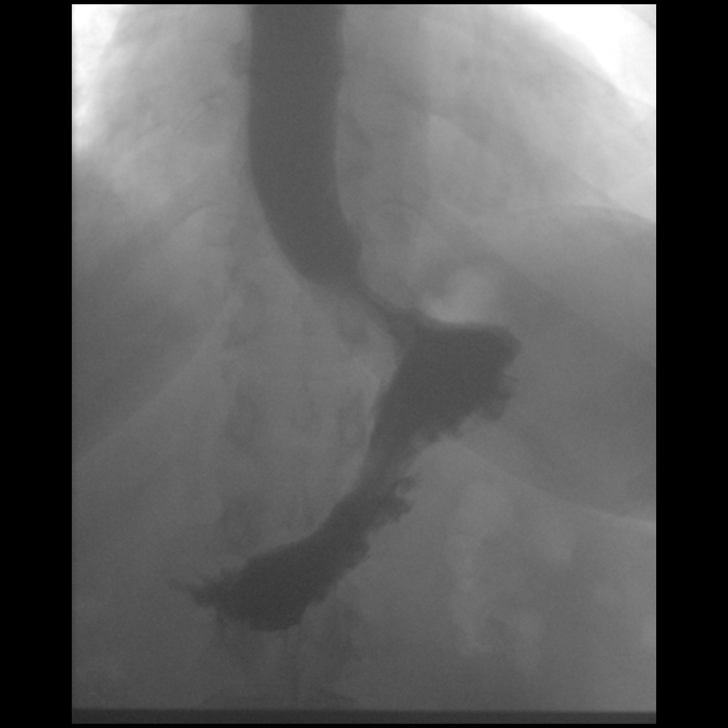

[Series 5: cp_standard · 0.18mm/px · 1 of 1 slices shown (3 of 9)]
[im 1/1]
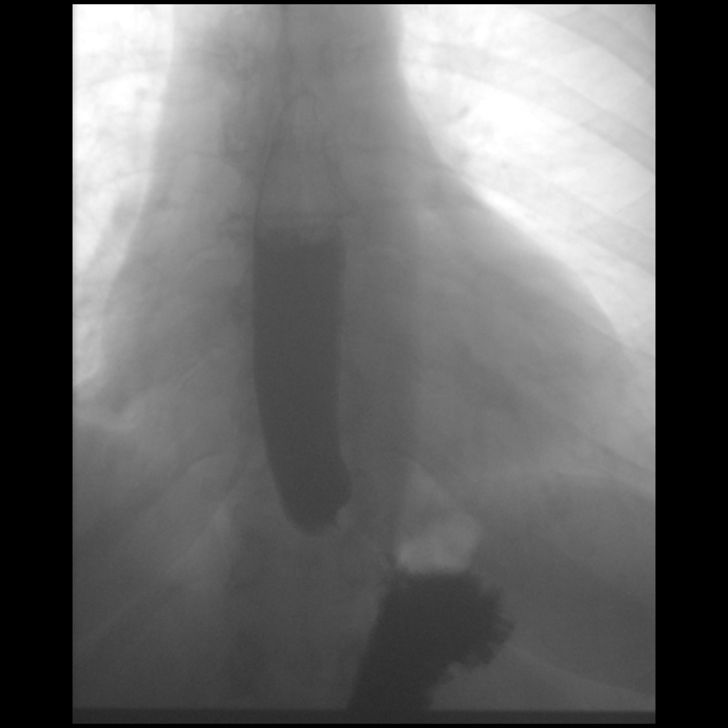

[Series 6: cp_standard · 0.36mm/px · 1 of 13 frames shown (4 of 9)]
[frame 7/13]
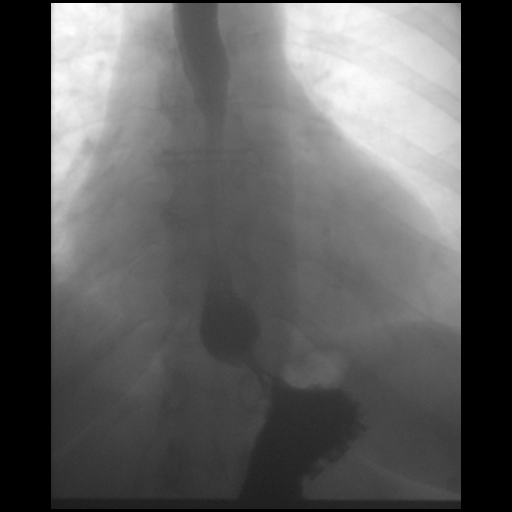

[Series 7: cp_standard · 0.18mm/px · 1 of 1 slices shown (5 of 9)]
[im 1/1]
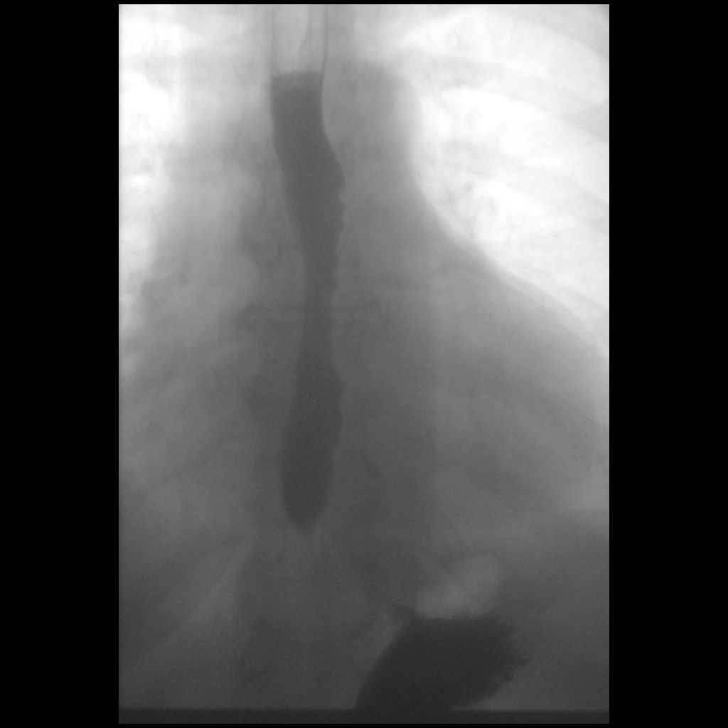

[Series 10: cp_standard · 0.36mm/px · 3 of 8 frames shown (6 of 9)]
[frame 2/8]
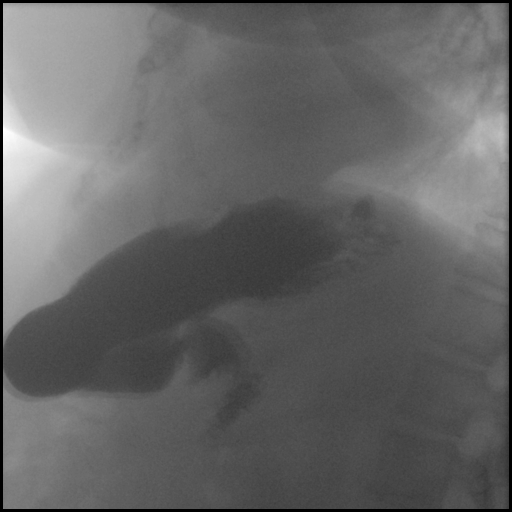
[frame 5/8]
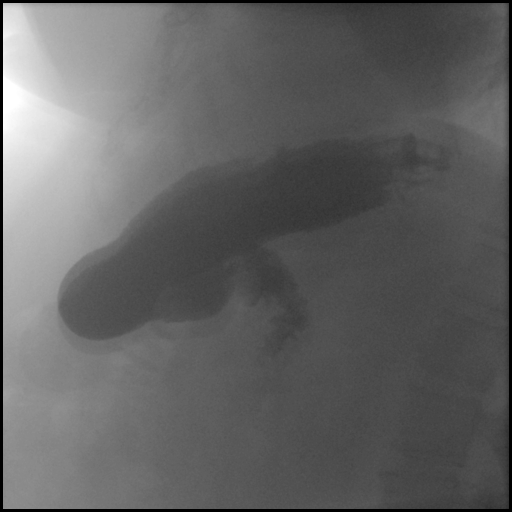
[frame 7/8]
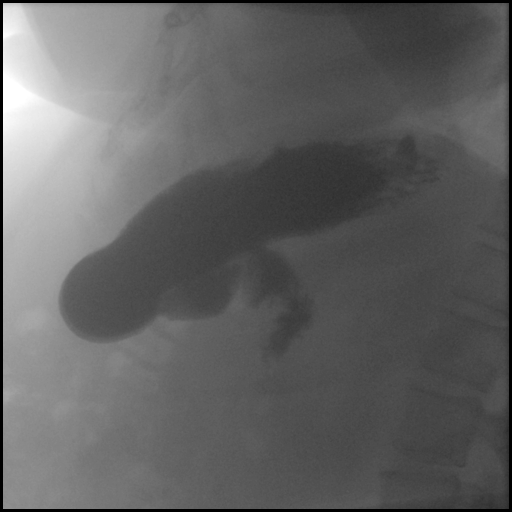

[Series 12: cp_standard · 0.18mm/px · 1 of 1 slices shown (7 of 9)]
[im 1/1]
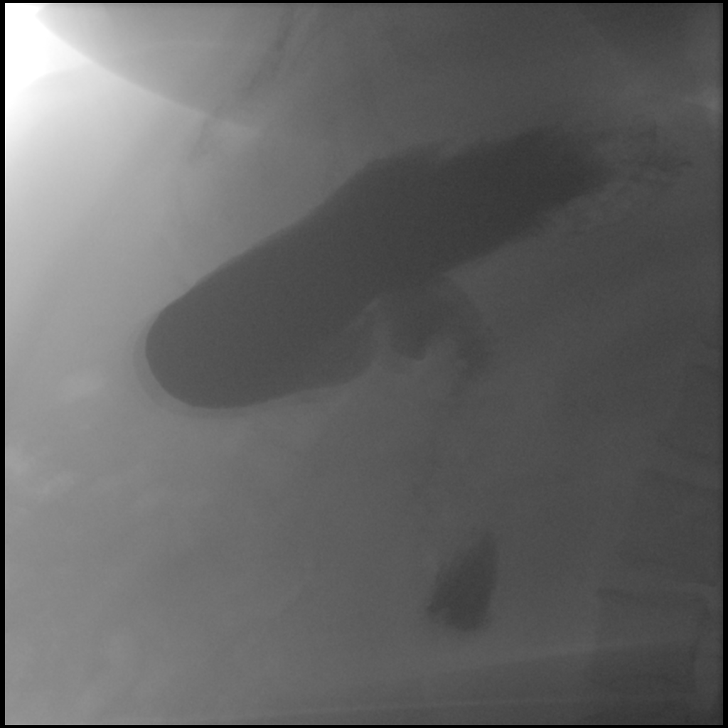

[Series 14: cp_standard · 0.19mm/px · 1 of 1 slices shown (8 of 9)]
[im 1/1]
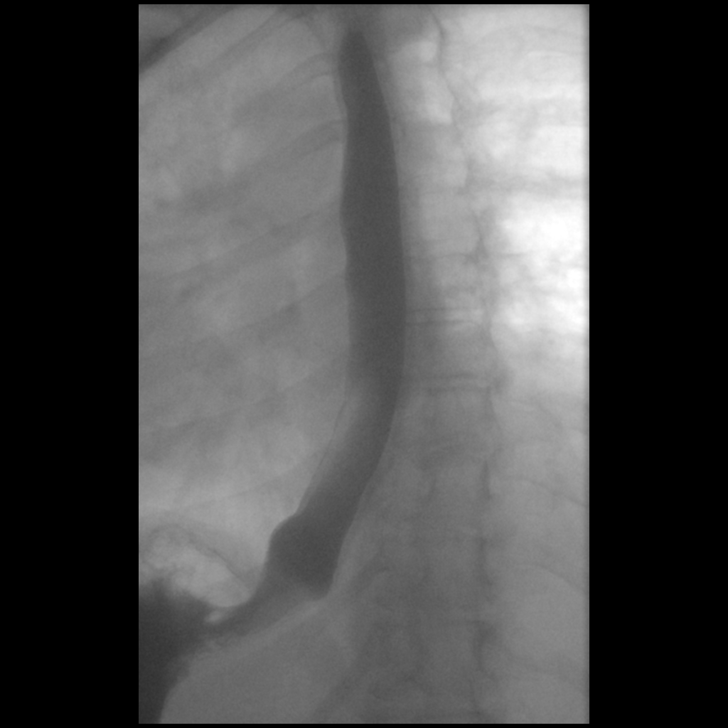

[Series 17: cp_standard · 0.19mm/px · 1 of 1 slices shown (9 of 9)]
[im 1/1]
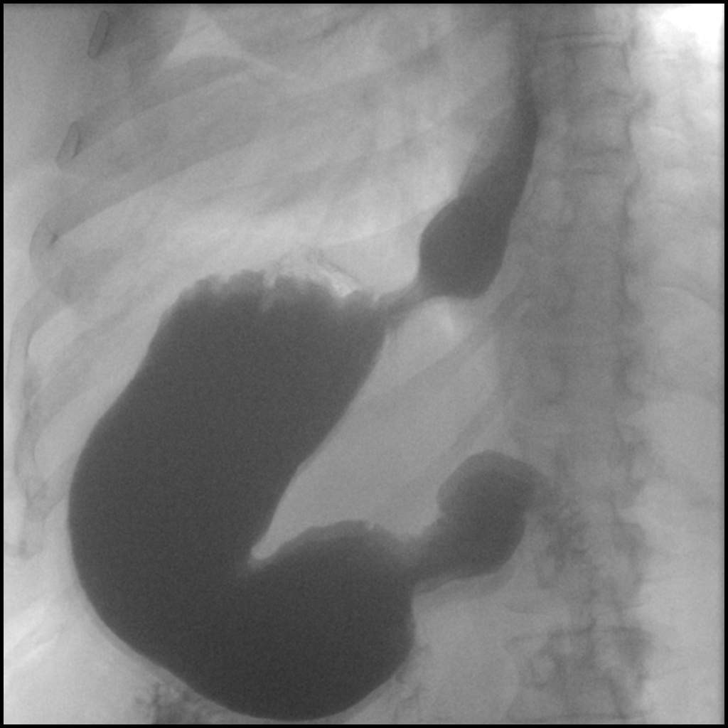

[Series 19: t abdomen barium · 0.15mm/px · 1 of 1 slices shown]
[im 1/1]
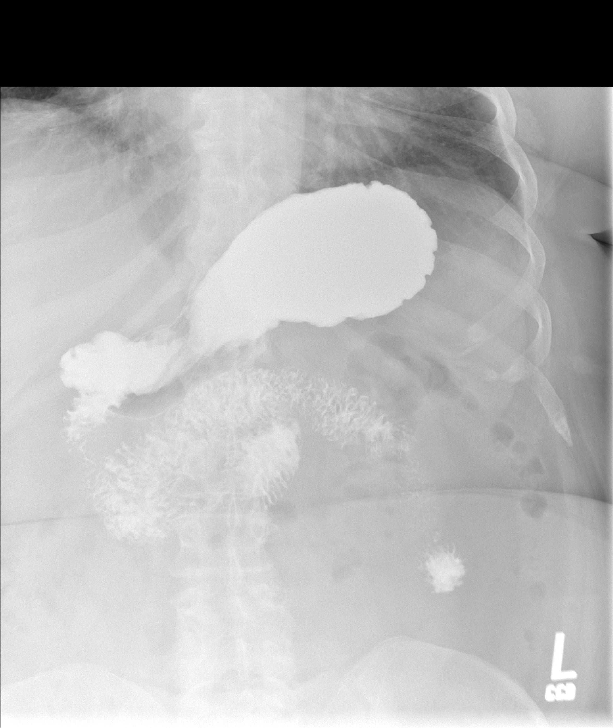

[13 of 24 positions shown; findings below may reference images not displayed]

FINDINGS: Supine scout view of the abdomen demonstrates a normal bowel gas
pattern. Negative visualized lung bases. No acute osseous
abnormality identified. Visible abdominal visceral contours appear
normal.

A single contrast study was undertaken and the patient tolerated
this well and without difficulty.

No obstruction to the forward flow of contrast throughout the
esophagus and into the stomach. Normal esophageal course and
contour. Occasional tertiary contractions occurred (series 7).

Prompt gastric emptying to the duodenum. Normal duodenum C loop
configuration. Ligament of Treitz level demonstrated on series 13.

With prone swallows there is no convincing hiatal hernia (series
16). Normal for age esophageal motility while prone.

Postprocedural repeat scout view demonstrates retained barium in the
stomach, duodenum Celsius loop, and proximal jejunum which is
located in the midline and left abdomen.
IMPRESSION: Negative preoperative upper GI.

Mild presbyesophagus.

## 2017-01-29 ENCOUNTER — Encounter: Payer: Self-pay | Admitting: Podiatry

## 2017-01-29 ENCOUNTER — Ambulatory Visit (INDEPENDENT_AMBULATORY_CARE_PROVIDER_SITE_OTHER): Payer: BLUE CROSS/BLUE SHIELD | Admitting: Podiatry

## 2017-01-29 VITALS — BP 134/70 | HR 65 | Ht 65.0 in | Wt 216.0 lb

## 2017-01-29 DIAGNOSIS — M79672 Pain in left foot: Secondary | ICD-10-CM

## 2017-01-29 DIAGNOSIS — L570 Actinic keratosis: Secondary | ICD-10-CM

## 2017-01-29 DIAGNOSIS — M7752 Other enthesopathy of left foot: Secondary | ICD-10-CM

## 2017-01-29 NOTE — Patient Instructions (Signed)
Seen for painful corn on conctact surfact 4th and 5th left. Debrided. Need office procedure, Osteotripsy 5th distal end to prevent recurring lesion. May do on Friday afternoon and be able to return to work on Monday.

## 2017-01-29 NOTE — Progress Notes (Signed)
Subjective: 61 year old female presents complaining of pain in left foot with corns in between 4th and 5th digit.   Objective: Neurovascular status are within normal. Positive of digital corn with mild erythema at lateral aspect of the PIPJ 4th digit and distal medial aspect of the 5th digit left foot. No other skin lesions noted. Enlarged and palpable bone spur distal medial aspect of the 5th digit left with pain.  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 Painful corn 4th and 5th digit left.  Plan: Reviewed clinical findings and available treatment options.  May benefit from resection of spur 5th digit left foot.  Area debrided and padded.  Discussed possible Osteotripsy procedure on 5th toe left at the office. Patient will set up her appointment for office surgery.

## 2019-06-09 ENCOUNTER — Encounter (HOSPITAL_COMMUNITY): Payer: Self-pay

## 2020-03-17 ENCOUNTER — Encounter (HOSPITAL_COMMUNITY): Payer: Self-pay

## 2021-04-06 ENCOUNTER — Encounter (HOSPITAL_COMMUNITY): Payer: Self-pay | Admitting: *Deleted

## 2022-03-30 ENCOUNTER — Encounter (HOSPITAL_COMMUNITY): Payer: Self-pay | Admitting: *Deleted
